# Patient Record
Sex: Female | Born: 1965 | Race: Black or African American | Hispanic: No | Smoking: Never smoker
Health system: Southern US, Community
[De-identification: ages and names within clinical notes are randomized; demographics above are authoritative.]

## PROBLEM LIST (undated history)

## (undated) DIAGNOSIS — D649 Anemia, unspecified: Secondary | ICD-10-CM

## (undated) DIAGNOSIS — E079 Disorder of thyroid, unspecified: Secondary | ICD-10-CM

## (undated) DIAGNOSIS — I10 Essential (primary) hypertension: Secondary | ICD-10-CM

## (undated) DIAGNOSIS — E039 Hypothyroidism, unspecified: Secondary | ICD-10-CM

---

## 1992-06-09 HISTORY — PX: WRIST SURGERY: SHX841

## 1998-06-09 HISTORY — PX: TUBAL LIGATION: SHX77

## 1998-10-22 ENCOUNTER — Other Ambulatory Visit: Admission: RE | Admit: 1998-10-22 | Discharge: 1998-10-22 | Payer: Self-pay | Admitting: Obstetrics and Gynecology

## 1999-04-24 ENCOUNTER — Inpatient Hospital Stay (HOSPITAL_COMMUNITY): Admission: AD | Admit: 1999-04-24 | Discharge: 1999-04-29 | Payer: Self-pay | Admitting: Obstetrics and Gynecology

## 1999-04-24 ENCOUNTER — Encounter (INDEPENDENT_AMBULATORY_CARE_PROVIDER_SITE_OTHER): Payer: Self-pay | Admitting: Specialist

## 2001-04-26 ENCOUNTER — Encounter: Admission: RE | Admit: 2001-04-26 | Discharge: 2001-04-26 | Payer: Self-pay | Admitting: Cardiology

## 2001-11-23 ENCOUNTER — Other Ambulatory Visit: Admission: RE | Admit: 2001-11-23 | Discharge: 2001-11-23 | Payer: Self-pay | Admitting: Obstetrics and Gynecology

## 2002-11-29 ENCOUNTER — Other Ambulatory Visit: Admission: RE | Admit: 2002-11-29 | Discharge: 2002-11-29 | Payer: Self-pay | Admitting: Obstetrics and Gynecology

## 2003-11-10 ENCOUNTER — Other Ambulatory Visit: Admission: RE | Admit: 2003-11-10 | Discharge: 2003-11-10 | Payer: Self-pay | Admitting: *Deleted

## 2003-12-14 ENCOUNTER — Other Ambulatory Visit: Admission: RE | Admit: 2003-12-14 | Discharge: 2003-12-14 | Payer: Self-pay | Admitting: *Deleted

## 2004-04-16 ENCOUNTER — Emergency Department (HOSPITAL_COMMUNITY): Admission: EM | Admit: 2004-04-16 | Discharge: 2004-04-16 | Payer: Self-pay | Admitting: Emergency Medicine

## 2004-04-16 ENCOUNTER — Inpatient Hospital Stay (HOSPITAL_COMMUNITY): Admission: AD | Admit: 2004-04-16 | Discharge: 2004-04-16 | Payer: Self-pay | Admitting: Family Medicine

## 2005-01-23 ENCOUNTER — Other Ambulatory Visit: Admission: RE | Admit: 2005-01-23 | Discharge: 2005-01-23 | Payer: Self-pay | Admitting: *Deleted

## 2005-06-16 ENCOUNTER — Other Ambulatory Visit: Admission: RE | Admit: 2005-06-16 | Discharge: 2005-06-16 | Payer: Self-pay | Admitting: Obstetrics and Gynecology

## 2005-10-21 ENCOUNTER — Encounter: Admission: RE | Admit: 2005-10-21 | Discharge: 2005-10-21 | Payer: Self-pay | Admitting: Obstetrics and Gynecology

## 2006-04-24 ENCOUNTER — Other Ambulatory Visit: Admission: RE | Admit: 2006-04-24 | Discharge: 2006-04-24 | Payer: Self-pay | Admitting: Obstetrics & Gynecology

## 2006-12-24 ENCOUNTER — Encounter: Admission: RE | Admit: 2006-12-24 | Discharge: 2006-12-24 | Payer: Self-pay | Admitting: Obstetrics and Gynecology

## 2007-05-11 ENCOUNTER — Other Ambulatory Visit: Admission: RE | Admit: 2007-05-11 | Discharge: 2007-05-11 | Payer: Self-pay | Admitting: Obstetrics and Gynecology

## 2008-04-10 ENCOUNTER — Encounter: Admission: RE | Admit: 2008-04-10 | Discharge: 2008-04-10 | Payer: Self-pay | Admitting: Obstetrics and Gynecology

## 2008-05-19 ENCOUNTER — Other Ambulatory Visit: Admission: RE | Admit: 2008-05-19 | Discharge: 2008-05-19 | Payer: Self-pay | Admitting: Obstetrics and Gynecology

## 2009-04-10 ENCOUNTER — Encounter: Admission: RE | Admit: 2009-04-10 | Discharge: 2009-04-10 | Payer: Self-pay | Admitting: Obstetrics and Gynecology

## 2010-06-04 ENCOUNTER — Encounter
Admission: RE | Admit: 2010-06-04 | Discharge: 2010-06-04 | Payer: Self-pay | Source: Home / Self Care | Attending: Obstetrics and Gynecology | Admitting: Obstetrics and Gynecology

## 2010-06-16 ENCOUNTER — Emergency Department (HOSPITAL_COMMUNITY)
Admission: EM | Admit: 2010-06-16 | Discharge: 2010-06-16 | Payer: Self-pay | Source: Home / Self Care | Admitting: Emergency Medicine

## 2010-06-24 LAB — BASIC METABOLIC PANEL
BUN: 8 mg/dL (ref 6–23)
CO2: 23 mEq/L (ref 19–32)
Calcium: 9 mg/dL (ref 8.4–10.5)
Chloride: 108 mEq/L (ref 96–112)
Creatinine, Ser: 0.69 mg/dL (ref 0.4–1.2)
GFR calc Af Amer: >60 mL/min (ref >60)
GFR calc non Af Amer: >60 mL/min (ref >60)
Glucose, Bld: 104 mg/dL — ABNORMAL HIGH (ref 70–99)
Potassium: 4.3 mEq/L (ref 3.5–5.1)
Sodium: 137 mEq/L (ref 135–145)

## 2010-06-24 LAB — URINALYSIS, ROUTINE W REFLEX MICROSCOPIC
Bilirubin Urine: NEGATIVE
Hgb urine dipstick: NEGATIVE
Ketones, ur: NEGATIVE mg/dL
Nitrite: NEGATIVE
Protein, ur: NEGATIVE mg/dL
Specific Gravity, Urine: 1.02 (ref 1.005–1.030)
Urine Glucose, Fasting: NEGATIVE mg/dL
Urobilinogen, UA: 1 mg/dL (ref 0.0–1.0)
pH: 7.5 (ref 5.0–8.0)

## 2010-06-24 LAB — DIFFERENTIAL
Basophils Absolute: 0 10*3/uL (ref 0.0–0.1)
Basophils Relative: 0 % (ref 0–1)
Eosinophils Absolute: 0 10*3/uL (ref 0.0–0.7)
Eosinophils Relative: 0 % (ref 0–5)
Lymphocytes Relative: 22 % (ref 12–46)
Lymphs Abs: 1.1 10*3/uL (ref 0.7–4.0)
Monocytes Absolute: 0.3 10*3/uL (ref 0.1–1.0)
Monocytes Relative: 6 % (ref 3–12)
Neutro Abs: 3.6 10*3/uL (ref 1.7–7.7)
Neutrophils Relative %: 72 % (ref 43–77)

## 2010-06-24 LAB — CBC
HCT: 35.3 % — ABNORMAL LOW (ref 36.0–46.0)
Hemoglobin: 11.8 g/dL — ABNORMAL LOW (ref 12.0–15.0)
MCH: 32.8 pg (ref 26.0–34.0)
MCHC: 33.4 g/dL (ref 30.0–36.0)
MCV: 98.1 fL (ref 78.0–100.0)
Platelets: 205 10*3/uL (ref 150–400)
RBC: 3.6 MIL/uL — ABNORMAL LOW (ref 3.87–5.11)
RDW: 12.6 % (ref 11.5–15.5)
WBC: 5 10*3/uL (ref 4.0–10.5)

## 2010-06-24 LAB — POCT PREGNANCY, URINE: Preg Test, Ur: NEGATIVE

## 2010-07-01 ENCOUNTER — Encounter: Payer: Self-pay | Admitting: Obstetrics and Gynecology

## 2010-07-10 ENCOUNTER — Encounter: Payer: Self-pay | Admitting: Obstetrics and Gynecology

## 2010-10-25 NOTE — H&P (Signed)
[name]  Patient:    Carol [name]                     MRN: [id number] Adm. Date:  [id number] Attending:  Shaune [name] Dictator:   [name], C.N.M.                         History and Physical  HISTORY OF PRESENT ILLNESS:   [name] is a 45 year old, gravida 4, para 1-0-2-1, at 41-2/7 weeks who presents for induction secondary to postdates.  Pregnancy has been remarkable for:  1) Previous low transverse cesarean section with the plan for a VBAC.  2) Desires bilateral tubal ligation.  3) Positive Group B Strep. 4) Fibroids.  5) Slightly late to care.  6) First trimester bleeding.  7) Two abortions.  PRENATAL LABORATORY DATA:     Blood type is AB positive, rh antibody negative, DRL nonreactive, rubella titer positive, hepatitis B surface antigen negative, HIV nonreactive, sickle cell test negative, GC and Chlamydia cultures were negative, Pap smear was normal, Glucose Challenge was normal, AFP was normal.  Hemoglobin  upon entry into practice was 11.6, it was 10.6 at 28 weeks.  Group B Strep culture was positive at 36 weeks.  EDC of April 13, 1999, was established by last menstrual period and was in agreement with ultrasound at approximately 18 weeks.  HISTORY OF PRESENT PREGNANCY: The patient entered care for her new OB interview at 9 weeks, but then had a missed appointment and did not have her first OB appointment until approximately 15 weeks.  At that time, she advised that she would like to attempt a VBAC.  Records were reviewed from [name] in  [place], which documented a primary low transverse cesarean section with her previous birth.  She also advised that she wished to have a bilateral tubal ligation following the birth of her child.  She did have some musculoskeletal pain in her right breast, but no other issues.  She had a yeast infection at 27 weeks. She met with [name], M.D.  at 37 weeks to discuss bilateral tubal ligation.  She had an ultrasound at 36 weeks secondary to poor anatomy views at  last visit and previous cesarean section.  AFI was 9.8, cervix was 4.1 cm, and estimated fetal weight was in the 50th percentile.  The rest of the patients pregnancy was uncomplicated.  She did have a nonstress test at 41 weeks that was reactive.  PAST OBSTETRIC HISTORY:       In 1992 she had a primary low transverse cesarean  section for a female infant, weight 9 pounds 5 ounces at [redacted] weeks gestation.  She was in labor approximately 36 hours.  She had general anesthesia.  She advanced to cm in her labor.  She also had chorioamnionitis.  In 1997, she had a spontaneous miscarriage at 6 to [redacted] weeks gestation.  She did have a D&C.  In February of 1999 she had a 12-week therapeutic termination of pregnancy.  She did have anemia with her first pregnancy and was on iron supplementation.  She did have blues after er first pregnancy, but no significant postpartum depression.  She had spontaneous  rupture of membranes approximately 48 hours prior to going to the [name] with her first pregnancy and then was induced.  Fibroids were also diagnosed with her first pregnancy.  PAST MEDICAL HISTORY:  She was on Ortho-Novum 135 and stopped approximately two years ago and then has been a condom user since.  She had one abnormal Pap smear that was treated with an over-the-counter antifungal and a repeat was normal. She has had future sporadic yeast infections.  She has had sporadic episodes of  anemia.  She had her last UTI in 1996.  She did have a kidney infection in 1993 and was treatment with outpatient antibiotics by mouth.  PAST SURGICAL HISTORY:        Includes a cesarean section primary low transverse in 1992, a D&C in 1997, and wrist surgery in 1997.  She has sensitivity to shrimp nd fish which cause hives and swelling.  FAMILY HISTORY:               Her  maternal aunt had a stillborn.  Her maternal unt also had a baby born with down syndrome with advanced maternal age.  Paternal grandmother had an MI.  Paternal cousin had TB in 1966.  Her paternal grandfather had insulin-dependent diabetes mellitus and is now deceased.  Her paternal aunt  also has insulin-dependent diabetes mellitus.  Her maternal aunt has thyroid disease.  Her maternal aunt had a nervous breakdown and also had an abusive spouse.  GENETIC HISTORY:              Remarkable for the patients maternal first cousin  having a baby with downs at age 33.  The patient has a paternal first cousin with sickle cell trait and a maternal first cousin with mental retardation.  SOCIAL HISTORY:               The patient is single.  The father of the baby has been involved and supportive.  His name is Carol [name].  He is not currently present with her.  The patient is supported by her sister, Carol [name].  The patient is college educated and is employed at a [employer].  The patient is African-American in ethnicity.  She is of the [employer].  She has been followed by the certified nurse midwife service of [name].  She denies any alcohol, drug, or tobacco use during this pregnancy.  PHYSICAL EXAMINATION:  VITAL SIGNS:                  Stable.  The patient is afebrile.  HEENT:                        Within normal limits.  LUNGS:                        Bilateral breath sounds are clear.  HEART:                        Regular rate and rhythm without murmur.  BREASTS:                      Soft and nontender.  ABDOMEN:                      Fundal height is approximately 38 cm.  Estimated fetal weight 7-1/2 to 8 pounds.  Uterine contractions are every four to five minutes, mild quality.  PELVIC:                       Cervical examination fingertip, 60%,  vertex at -1  station.  Cervix is relatively anterior.  Fetal heart rate is reactive  with occasional mild variables.  The baseline is 150 to 160 with accellerations up to 180.  EXTREMITIES:                  Deep tendon reflexes are 2+ without clonus. There is a trace edema noted.   IMPRESSION:                   1. Intrauterine pregnancy at 41-2/7 weeks.                               2. Positive Group B Strep.                               3. Previous cesarean section with desire for                                  vaginal birth after cesarean section.                               4. Uterine activity which precludes use of topical                                  cervical ripening agents.  PLAN:                         1) Admit to birthing suite per consult with Erie Noe P. Pennie Rushing, M.D. as attending physician.  2) Routine certified nurse midwife orders.  3) Plan low dose Pitocin for cervical ripening during the night and then initiation of regular Pitocin induction in the a.m.  4) Positive Group B Strep prophylaxis will be undertaken with penicillin-G 5 million units, then 2.5 million units IV q.4h.  5) Close observation of maternal fetal status and progress of labor.  6) The patient plans bilateral tubal ligation. DD:  04/24/99 TD:  04/24/99 Job: [id number]

## 2010-10-25 NOTE — Op Note (Signed)
[facility]  Patient:    Carol [name]                     MRN: [id number] Proc. Date: 04/26/99 Adm. Date:  [id number] Attending:  Dierdre Forth [place]                           Operative Report  PREOPERATIVE DIAGNOSIS:       Previous cesarean section and desires vaginal birth                               after cesarean, post dates and desires permanent                               sterilization with failure to progress.  POSTOPERATIVE DIAGNOSIS:      Previous cesarean section and desires vaginal birth                               after cesarean, post dates and desires permanent                               sterilization with failure to progress.  OPERATION:                    Repeat low cervical transverse cesarean section with                               postpartum tubal ligation by Pomeroy method.  SURGEON:                      Georgina Peer, M.D.  ASSISTANT:                    Maggie Schwalbe, C.N.M.  ANESTHESIA:                   Epidural - J. Leilani Able, Montez Hageman., M.D.  ESTIMATED BLOOD LOSS:         800 cc.  FINDINGS:                     A 10 pound and 5.9 ounce female infant at 6:28 a.m.  Meconium stained fluid.  Apgars of 4 and 8.  Normal tubes and ovaries.  INDICATIONS:                  This is a 45 year old African-American female, gravida 4, para 1-0-2-1 at 41-4/7 weeks.  Underwent induction for post dates because she desired VBAC.  She also desired tubal ligation postpartum.  The patient progressed in labor with Pitocin.  She reached 6 cm, but did not progress pass cm despite adequate labor.  The vertex did not descend beyond minus 2 station. She was counseled on risks and benefits of C-section and accepted, and planned tubal ligation.  DESCRIPTION OF PROCEDURE:     The patient had had an epidural placed during labor, and a Foley catheter placed during labor.  She was taken to the operating room nd given  adequate operative dosing.  She was prepped and draped.  Fetal heart tones were reactive.  A transverse incision was made in the skin through the previous  incision and taken to the peritoneal cavity in a normal Pfannenstiel manner. Bleeders were cauterized.  The peritoneal opening was widened sharply.  A transverse bladder flap was created and the bladder pushed down over the anterior uterine segment.  This was covered with a bladder blade.  A transverse uterine incision was made and at 6:28, an infant was dislodged from the pelvis in a transverse position.  There was particular meconium noted, ________ suction of he mouth and nose, and bulb suction was accomplished before the thorax was delivered and the baby cried.  The infant was born at 6:28, a female infant and was handed o the pediatric team in attendance after the cord was doubly clamped and cut. Apgars were sighted at 4 and 8.  The weight was 10 pounds and 9.9 ounces.  The uterine  incision was without extension and closed with one layer with running locked Vicryl.  A second layer of Vicryl in the midportion was used to control bleeding. Small bleeders along the bladder flap were cauterized, and the tubal ligation was accomplished by identifying the left tube with the ovary being normal.                                Next, the tube was elevated in its midportion and the knuckle of the tube was tied with two plain ties and the knuckle excised. Bleeders were cauterized.  The right tube was similarly elevated, normal ovary next to it. Two ties were placed around it and the knuckle excised.  This was sent to pathology.   There were small subserosal fibroids noted on the uterus, all less  than 1 cm.                                At this point, the uterine incision appeared dry.  Any bleeders were cauterized and the fascia was closed with Vicryl.  The subcutaneous tissue cauterized for any bleeding.  The skin edges opposed  well and the skin was closed with skin staples.  Sponge, needle and instrument counts were correct and the patient transferred to the recovery area in good condition.                                The patient did received 1 g of Cefotan after the  cord was clamped. DD:  04/26/99 TD:  04/29/99 Job: [id number]

## 2010-11-07 ENCOUNTER — Inpatient Hospital Stay (INDEPENDENT_AMBULATORY_CARE_PROVIDER_SITE_OTHER)
Admission: RE | Admit: 2010-11-07 | Discharge: 2010-11-07 | Disposition: A | Discharge status: Home / Self Care | Payer: PRIVATE HEALTH INSURANCE | Source: Ambulatory Visit | Attending: Emergency Medicine | Admitting: Emergency Medicine

## 2010-11-07 DIAGNOSIS — J019 Acute sinusitis, unspecified: Secondary | ICD-10-CM

## 2010-11-07 DIAGNOSIS — J3489 Other specified disorders of nose and nasal sinuses: Secondary | ICD-10-CM

## 2012-05-04 ENCOUNTER — Other Ambulatory Visit: Payer: Self-pay | Admitting: Obstetrics and Gynecology

## 2012-05-04 DIAGNOSIS — Z1231 Encounter for screening mammogram for malignant neoplasm of breast: Secondary | ICD-10-CM

## 2012-06-14 ENCOUNTER — Ambulatory Visit
Admission: RE | Admit: 2012-06-14 | Discharge: 2012-06-14 | Disposition: A | Discharge status: Home / Self Care | Payer: PRIVATE HEALTH INSURANCE | Source: Ambulatory Visit | Attending: Obstetrics and Gynecology | Admitting: Obstetrics and Gynecology

## 2012-06-14 DIAGNOSIS — Z1231 Encounter for screening mammogram for malignant neoplasm of breast: Secondary | ICD-10-CM

## 2012-08-01 ENCOUNTER — Emergency Department (HOSPITAL_COMMUNITY)
Admission: EM | Admit: 2012-08-01 | Discharge: 2012-08-01 | Disposition: A | Discharge status: Home / Self Care | Payer: PRIVATE HEALTH INSURANCE | Source: Home / Self Care | Attending: Family Medicine | Admitting: Family Medicine

## 2012-08-01 ENCOUNTER — Encounter (HOSPITAL_COMMUNITY): Payer: Self-pay | Admitting: Emergency Medicine

## 2012-08-01 DIAGNOSIS — J309 Allergic rhinitis, unspecified: Secondary | ICD-10-CM

## 2012-08-01 HISTORY — DX: Disorder of thyroid, unspecified: E07.9

## 2012-08-01 MED ORDER — METHYLPREDNISOLONE ACETATE 40 MG/ML IJ SUSP
80.0000 mg | Freq: Once | INTRAMUSCULAR | Status: AC
  Administered 2012-08-01: 80 mg via INTRAMUSCULAR

## 2012-08-01 MED ORDER — METHYLPREDNISOLONE ACETATE 80 MG/ML IJ SUSP
INTRAMUSCULAR | Status: AC
  Filled 2012-08-01: qty 1

## 2012-08-01 MED ORDER — FLUTICASONE PROPIONATE 50 MCG/ACT NA SUSP: 1.0000 | Freq: Two times a day (BID) | NASAL | Status: DC

## 2012-08-01 MED ORDER — FEXOFENADINE HCL 180 MG PO TABS: 180.0000 mg | ORAL_TABLET | Freq: Every day | ORAL | Status: DC

## 2012-08-01 NOTE — ED Notes (Signed)
Pt c/o sinus pressure and pain. Productive cough with green sputum. Pt denies fever, n/v/d. Sob and chest pain Pt states she has radioactive treatment for thyroid 3 wks ago. Hx thyroid disease.

## 2012-08-01 NOTE — ED Provider Notes (Signed)
History     CSN: [id number]  Arrival date & time 08/01/12  1101   First MD Initiated Contact with Patient 08/01/12 1104      Chief Complaint  Patient presents with  . Facial Pain    sinus pressure and pain. productive cough with green sputum.     (Consider location/radiation/quality/duration/timing/severity/associated sxs/prior treatment) Patient is a 47 y.o. female presenting with URI. The history is provided by the patient.  URI Presenting symptoms: congestion, cough and rhinorrhea   Presenting symptoms: no fever   Severity:  Mild Onset quality:  Gradual Timing:  Constant Progression:  Worsening Chronicity:  New Relieved by:  Nothing Worsened by:  Nothing tried Associated symptoms: sneezing   Associated symptoms comment:  Seasonal allergies.   Past Medical History  Diagnosis Date  . Thyroid disease     History reviewed. No pertinent past surgical history.  History reviewed. No pertinent family history.  History  Substance Use Topics  . Smoking status: Never Smoker   . Smokeless tobacco: Not on file  . Alcohol Use: No    OB History   Grav Para Term Preterm Abortions TAB SAB Ect Mult Living                  Review of Systems  Constitutional: Negative.  Negative for fever.  HENT: Positive for congestion, rhinorrhea, sneezing and postnasal drip.   Respiratory: Positive for cough.     Allergies  Review of patient's allergies indicates no known allergies.  Home Medications   Current Outpatient Rx  Name  Route  Sig  Dispense  Refill  . fexofenadine (ALLEGRA) 180 MG tablet   Oral   Take 1 tablet (180 mg total) by mouth daily.   30 tablet   1   . fluticasone (FLONASE) 50 MCG/ACT nasal spray   Nasal   Place 1 spray into the nose 2 (two) times daily.   1 g   2     BP 120/80  Pulse 90  Temp(Src) 98.2 F (36.8 C) (Oral)  Resp 16  SpO2 100%  LMP 07/29/2012  Physical Exam  Nursing note and vitals reviewed. Constitutional: She is oriented to  person, place, and time. She appears well-developed and well-nourished.  HENT:  Head: Normocephalic.  Right Ear: External ear normal.  Left Ear: External ear normal.  Mouth/Throat: Oropharynx is clear and moist.  Eyes: Pupils are equal, round, and reactive to light.  Neck: Normal range of motion. Neck supple.  Cardiovascular: Normal rate, regular rhythm, normal heart sounds and intact distal pulses.   Pulmonary/Chest: Effort normal and breath sounds normal.  Neurological: She is alert and oriented to person, place, and time.  Skin: Skin is warm and dry.    ED Course  Procedures (including critical care time)  Labs Reviewed - No data to display No results found.   1. Seasonal allergies       MDM          Linna Hoff, MD 08/01/12 [id number]

## 2012-08-05 ENCOUNTER — Ambulatory Visit: Payer: Self-pay | Admitting: Obstetrics and Gynecology

## 2012-08-09 ENCOUNTER — Encounter: Payer: Self-pay | Admitting: Family Medicine

## 2012-08-09 ENCOUNTER — Ambulatory Visit: Payer: PRIVATE HEALTH INSURANCE | Admitting: Family Medicine

## 2012-08-09 VITALS — BP 112/60 | HR 76 | Ht 65.0 in | Wt 158.0 lb

## 2012-08-09 DIAGNOSIS — N926 Irregular menstruation, unspecified: Secondary | ICD-10-CM

## 2012-08-09 DIAGNOSIS — Z113 Encounter for screening for infections with a predominantly sexual mode of transmission: Secondary | ICD-10-CM

## 2012-08-09 DIAGNOSIS — Z0000 Encounter for general adult medical examination without abnormal findings: Secondary | ICD-10-CM

## 2012-08-09 DIAGNOSIS — D219 Benign neoplasm of connective and other soft tissue, unspecified: Secondary | ICD-10-CM

## 2012-08-09 DIAGNOSIS — N920 Excessive and frequent menstruation with regular cycle: Secondary | ICD-10-CM

## 2012-08-09 LAB — LIPID PANEL
Cholesterol: 192 mg/dL (ref 0–200)
HDL: 87 mg/dL (ref >39)
LDL Cholesterol: 97 mg/dL (ref 0–99)
Total CHOL/HDL Ratio: 2.2 Ratio
Triglycerides: 40 mg/dL (ref <150)
VLDL: 8 mg/dL (ref 0–40)

## 2012-08-09 LAB — CBC
HCT: 34.3 % — ABNORMAL LOW (ref 36.0–46.0)
Hemoglobin: 11.6 g/dL — ABNORMAL LOW (ref 12.0–15.0)
MCH: 30.1 pg (ref 26.0–34.0)
MCHC: 33.8 g/dL (ref 30.0–36.0)
MCV: 89.1 fL (ref 78.0–100.0)
Platelets: 263 10*3/uL (ref 150–400)
RBC: 3.85 MIL/uL — ABNORMAL LOW (ref 3.87–5.11)
RDW: 14.2 % (ref 11.5–15.5)
WBC: 5.2 10*3/uL (ref 4.0–10.5)

## 2012-08-09 LAB — HIV ANTIBODY (ROUTINE TESTING W REFLEX): HIV: NONREACTIVE

## 2012-08-09 LAB — RPR

## 2012-08-09 NOTE — Progress Notes (Signed)
Last Pap Dec 2012 WNL: Yes Regular Periods:yes Contraception: BTL  Monthly Breast exam:yes Tetanus<33yrs:yes Nl.Bladder Function:yes Daily BMs:yes Healthy Diet:yes Calcium:no Mammogram:yes Date of Mammogram: Jan 2014 Exercise:no Have often Exercise: N/A Seatbelt: yes Abuse at home: no Stressful work:yes Sigmoid-colonoscopy: no Bone Density:None PCP: ? Change in PMH: None Change in ZOX:WRUE

## 2012-08-09 NOTE — Progress Notes (Signed)
Subjective:    [name] is a 47 y.o. female, No obstetric history on file., who presents for an annual exam. The patient reports heavier menstrual cycles with clots.  Menses x 5 days, heavy flow, must wear a pad/tampoon to prevent soiling, changes her pads q 2 hours.  Menses occurs q 30 days without BTB.  Menstrual cycle:   LMP: Patient's last menstrual period was 08/07/2012.             Review of Systems Pertinent items are noted in HPI. Denies pelvic pain, urinary tract symptoms, vaginitis symptoms, irregular bleeding, menopausal symptoms, change in bowel habits or rectal bleeding   Objective:    BP 112/60  Pulse 76  Ht 5\' 5"  (1.651 m)  Wt 158 lb (71.668 kg)  BMI 26.29 kg/m2  LMP 08/07/2012 BP 112/60  Pulse 76  Ht 5\' 5"  (1.651 m)  Wt 158 lb (71.668 kg)  BMI 26.29 kg/m2  LMP 08/07/2012  Wt Readings from Last 1 Encounters:  08/09/12 158 lb (71.668 kg)   Body mass index is 26.29 kg/(m^2). General Appearance: Alert, no acute distress HEENT: Grossly normal Neck / Thyroid: Supple, no thyromegaly or cervical adenopathy Lungs: Clear to auscultation bilaterally Back: No CVA tenderness Breast Exam: No masses or nodes.No dimpling, nipple retraction or discharge. Cardiovascular: Regular rate and rhythm.  Gastrointestinal: Soft, non-tender, no masses or organomegaly Pelvic Exam: EGBUS-wnl, vagina-normal rugae, cervix- without lesions or tenderness, uterus appears large, with noticeable mass, but mobile and measures about 12 cm from pelvic bone, adnexae-no masses or tenderness. Lymphatic Exam: Non-palpable nodes in neck, clavicular,  axillary, or inguinal regions  Skin: no rashes or abnormalities Extremities: no clubbing cyanosis or edema  Neurologic: grossly normal Psychiatric: Alert and oriented  Wet Prep: menses too heavy    Assessment:   Routine GYN Exam Excessive Menses   Plan:  1. Pelvic U/S for evaluation of uterine mass 2. Labs for STD screening  (GC/CL/HIV/RPR)  patient request and Lipid profile(pt fasting). 3. CBC for excessive menses.   Elane Fritz FNP-BC

## 2012-08-10 LAB — GC/CHLAMYDIA PROBE AMP
CT Probe RNA: NEGATIVE
GC Probe RNA: NEGATIVE

## 2012-08-17 ENCOUNTER — Telehealth: Payer: Self-pay | Admitting: Obstetrics and Gynecology

## 2012-08-17 NOTE — Telephone Encounter (Signed)
LVM to advise of normal labs  Cresencia Asmus, CMA  

## 2012-08-20 ENCOUNTER — Ambulatory Visit: Payer: Self-pay | Admitting: Family Medicine

## 2012-12-04 ENCOUNTER — Encounter (HOSPITAL_COMMUNITY): Payer: Self-pay | Admitting: Emergency Medicine

## 2012-12-04 ENCOUNTER — Emergency Department (HOSPITAL_COMMUNITY)
Admission: EM | Admit: 2012-12-04 | Discharge: 2012-12-04 | Disposition: A | Discharge status: Home / Self Care | Payer: PRIVATE HEALTH INSURANCE | Source: Home / Self Care

## 2012-12-04 DIAGNOSIS — B372 Candidiasis of skin and nail: Secondary | ICD-10-CM

## 2012-12-04 MED ORDER — CLOTRIMAZOLE-BETAMETHASONE 1-0.05 % EX CREA: TOPICAL_CREAM | CUTANEOUS | Status: DC

## 2012-12-04 NOTE — ED Provider Notes (Signed)
Medical screening examination/treatment/procedure(s) were performed by non-physician practitioner and as supervising physician I was immediately available for consultation/collaboration.  Leslee Home, M.D.  Reuben Likes, MD 12/04/12 [id number]

## 2012-12-04 NOTE — ED Provider Notes (Signed)
   History    CSN: [id number] Arrival date & time 12/04/12  1555  First MD Initiated Contact with Patient 12/04/12 1701     Chief Complaint  Patient presents with  . Rash   (Consider location/radiation/quality/duration/timing/severity/associated sxs/prior Treatment) HPI Comments: 47 year old female complains of a slightly itchy rash in the left inguinal fold for about one week.  Past Medical History  Diagnosis Date  . Thyroid disease    Past Surgical History  Procedure Laterality Date  . Cesarean section    . Wrist surgery Right 1994   No family history on file. History  Substance Use Topics  . Smoking status: Never Smoker   . Smokeless tobacco: Not on file  . Alcohol Use: No   OB History   Grav Para Term Preterm Abortions TAB SAB Ect Mult Living                 Review of Systems  All other systems reviewed and are negative.    Allergies  Review of patient's allergies indicates no known allergies.  Home Medications   Current Outpatient Rx  Name  Route  Sig  Dispense  Refill  . levothyroxine (SYNTHROID, LEVOTHROID) 125 MCG tablet   Oral   Take 125 mcg by mouth daily before breakfast.         . fexofenadine (ALLEGRA) 180 MG tablet   Oral   Take 1 tablet (180 mg total) by mouth daily.   30 tablet   1   . fluticasone (FLONASE) 50 MCG/ACT nasal spray   Nasal   Place 1 spray into the nose 2 (two) times daily.   1 g   2    BP 133/73  Pulse 77  Temp(Src) 98.2 F (36.8 C) (Oral)  Resp 18  SpO2 99%  LMP 11/15/2012 Physical Exam  Nursing note and vitals reviewed. Constitutional: She is oriented to person, place, and time. She appears well-developed and well-nourished.  Pulmonary/Chest: Effort normal.  Neurological: She is alert and oriented to person, place, and time. She exhibits normal muscle tone.  Skin: Skin is warm and dry. Rash noted.  Well marginated mildly erythemic and rough texture rash with a slimy discharge in the left inguinal fold at  the skin contact areas. No areas of infection and no areas of skin breakdown.  Psychiatric: She has a normal mood and affect.    ED Course  Procedures (including critical care time) Labs Reviewed - No data to display No results found. 1. Candidal dermatitis     MDM  Keep area dry. After showering use a hairdryer to keep the inguinal folds dry. Lotrisone cream to the affected area twice a day and use for 5 days after disappearance of the rash.  Hayden Rasmussen, NP 12/04/12 1746

## 2012-12-04 NOTE — ED Notes (Signed)
Pt c/o rash in the left side inguinal area near the groin onset 1 week... sxs include itchiness... Denies fevers... Reports she just started a new dose of thyroid meds... She is alert w/no signs of acute distress.

## 2013-04-06 ENCOUNTER — Ambulatory Visit: Payer: Self-pay | Admitting: Podiatry

## 2013-04-07 ENCOUNTER — Ambulatory Visit: Payer: Self-pay | Admitting: Podiatry

## 2013-12-01 DIAGNOSIS — E890 Postprocedural hypothyroidism: Secondary | ICD-10-CM | POA: Insufficient documentation

## 2014-03-08 ENCOUNTER — Other Ambulatory Visit: Payer: Self-pay

## 2014-03-08 DIAGNOSIS — Z1231 Encounter for screening mammogram for malignant neoplasm of breast: Secondary | ICD-10-CM

## 2014-03-21 ENCOUNTER — Ambulatory Visit
Admission: RE | Admit: 2014-03-21 | Discharge: 2014-03-21 | Disposition: A | Discharge status: Home / Self Care | Payer: PRIVATE HEALTH INSURANCE | Source: Ambulatory Visit

## 2014-03-21 DIAGNOSIS — Z1231 Encounter for screening mammogram for malignant neoplasm of breast: Secondary | ICD-10-CM

## 2014-04-10 ENCOUNTER — Ambulatory Visit: Payer: Self-pay | Admitting: Podiatry

## 2014-04-17 ENCOUNTER — Ambulatory Visit: Payer: Self-pay | Admitting: Podiatry

## 2014-04-24 ENCOUNTER — Ambulatory Visit: Payer: Self-pay | Admitting: Podiatry

## 2014-05-07 ENCOUNTER — Encounter (HOSPITAL_COMMUNITY): Payer: Self-pay | Admitting: *Deleted

## 2014-05-07 ENCOUNTER — Emergency Department (INDEPENDENT_AMBULATORY_CARE_PROVIDER_SITE_OTHER)
Admission: EM | Admit: 2014-05-07 | Discharge: 2014-05-07 | Disposition: A | Discharge status: Home / Self Care | Payer: PRIVATE HEALTH INSURANCE | Source: Home / Self Care | Attending: Family Medicine | Admitting: Family Medicine

## 2014-05-07 DIAGNOSIS — M79661 Pain in right lower leg: Secondary | ICD-10-CM

## 2014-05-07 NOTE — ED Notes (Signed)
Pt  Reports  Sensation of  Swelling       And  Tenderness  To   r  Lower  Leg       -       denys  Any known  Injury             No  Redness  No  Deformity  -  Pt  Sitting upright on  The  Exam table  And  Is  Speaking in  Complete  sentances

## 2014-05-07 NOTE — Discharge Instructions (Signed)
See dermatologist if further concern

## 2014-05-07 NOTE — ED Provider Notes (Signed)
CSN: [id number]     Arrival date & time 05/07/14  1649 History   First MD Initiated Contact with Patient 05/07/14 1702     Chief Complaint  Patient presents with  . Leg Problem   (Consider location/radiation/quality/duration/timing/severity/associated sxs/prior Treatment) Patient is a 47 y.o. female presenting with rash. The history is provided by the patient.  Rash Location:  Leg Leg rash location:  R lower leg Quality comment:  Nothing present. Duration:  1 month Progression:  Unchanged (onset as apparent bruise, now just sore to deep palp.) Chronicity:  Chronic   Past Medical History  Diagnosis Date  . Thyroid disease    Past Surgical History  Procedure Laterality Date  . Cesarean section    . Wrist surgery Right 1994   History reviewed. No pertinent family history. History  Substance Use Topics  . Smoking status: Never Smoker   . Smokeless tobacco: Not on file  . Alcohol Use: No   OB History    No data available     Review of Systems  Constitutional: Negative.   Skin: Positive for rash.    Allergies  Review of patient's allergies indicates no known allergies.  Home Medications   Prior to Admission medications   Medication Sig Start Date End Date Taking? Authorizing Provider  clotrimazole-betamethasone (LOTRISONE) cream Apply to affected area 2 times daily prn 12/04/12   Janne Napoleon, NP  fexofenadine (ALLEGRA) 180 MG tablet Take 1 tablet (180 mg total) by mouth daily. 08/01/12   Billy Fischer, MD  fluticasone (FLONASE) 50 MCG/ACT nasal spray Place 1 spray into the nose 2 (two) times daily. 08/01/12   Billy Fischer, MD  levothyroxine (SYNTHROID, LEVOTHROID) 125 MCG tablet Take 125 mcg by mouth daily before breakfast.    Historical Provider, MD   BP 137/88 mmHg  Pulse 77  Temp(Src) 98 F (36.7 C) (Oral)  Resp 16  SpO2 98%  LMP 04/18/2014 Physical Exam  Constitutional: She is oriented to person, place, and time. She appears well-developed and  well-nourished.  Musculoskeletal: She exhibits no edema or tenderness.  Neurological: She is alert and oriented to person, place, and time.  Skin: Skin is warm and dry. No rash noted. No erythema.  No rash or lesion seen, pt nonlocalizing, , no skin changes.  Nursing note and vitals reviewed.   ED Course  Procedures (including critical care time) Labs Review Labs Reviewed - No data to display  Imaging Review No results found.   MDM   1. Pain of right lower leg        Billy Fischer, MD 05/07/14 1723

## 2014-06-22 ENCOUNTER — Encounter (HOSPITAL_COMMUNITY): Payer: Self-pay | Admitting: Emergency Medicine

## 2014-06-22 ENCOUNTER — Emergency Department (INDEPENDENT_AMBULATORY_CARE_PROVIDER_SITE_OTHER)
Admission: EM | Admit: 2014-06-22 | Discharge: 2014-06-22 | Disposition: A | Discharge status: Home / Self Care | Payer: PRIVATE HEALTH INSURANCE | Source: Home / Self Care | Attending: Family Medicine | Admitting: Family Medicine

## 2014-06-22 ENCOUNTER — Observation Stay (HOSPITAL_COMMUNITY)
Admission: EM | Admit: 2014-06-22 | Discharge: 2014-06-23 | Disposition: A | Discharge status: Home / Self Care | Payer: PRIVATE HEALTH INSURANCE | Attending: Internal Medicine | Admitting: Internal Medicine

## 2014-06-22 DIAGNOSIS — I10 Essential (primary) hypertension: Principal | ICD-10-CM | POA: Insufficient documentation

## 2014-06-22 DIAGNOSIS — R030 Elevated blood-pressure reading, without diagnosis of hypertension: Secondary | ICD-10-CM

## 2014-06-22 DIAGNOSIS — H539 Unspecified visual disturbance: Secondary | ICD-10-CM | POA: Insufficient documentation

## 2014-06-22 DIAGNOSIS — Z79899 Other long term (current) drug therapy: Secondary | ICD-10-CM | POA: Diagnosis not present

## 2014-06-22 DIAGNOSIS — R079 Chest pain, unspecified: Secondary | ICD-10-CM | POA: Diagnosis not present

## 2014-06-22 DIAGNOSIS — F439 Reaction to severe stress, unspecified: Secondary | ICD-10-CM

## 2014-06-22 DIAGNOSIS — IMO0001 Reserved for inherently not codable concepts without codable children: Secondary | ICD-10-CM | POA: Diagnosis present

## 2014-06-22 DIAGNOSIS — Z7951 Long term (current) use of inhaled steroids: Secondary | ICD-10-CM | POA: Insufficient documentation

## 2014-06-22 DIAGNOSIS — R51 Headache: Secondary | ICD-10-CM | POA: Diagnosis present

## 2014-06-22 DIAGNOSIS — E039 Hypothyroidism, unspecified: Secondary | ICD-10-CM | POA: Diagnosis present

## 2014-06-22 DIAGNOSIS — Z658 Other specified problems related to psychosocial circumstances: Secondary | ICD-10-CM

## 2014-06-22 DIAGNOSIS — H538 Other visual disturbances: Secondary | ICD-10-CM | POA: Diagnosis present

## 2014-06-22 DIAGNOSIS — G44209 Tension-type headache, unspecified, not intractable: Secondary | ICD-10-CM

## 2014-06-22 HISTORY — DX: Hypothyroidism, unspecified: E03.9

## 2014-06-22 MED ORDER — SODIUM CHLORIDE 0.9 % IV BOLUS (SEPSIS)
1000.0000 mL | Freq: Once | INTRAVENOUS | Status: AC
  Administered 2014-06-23: 1000 mL via INTRAVENOUS

## 2014-06-22 NOTE — ED Notes (Addendum)
Pt states that she started to have a headache this morning about 0300, pt states that she had blurred vision at work which is now clear.

## 2014-06-22 NOTE — ED Notes (Signed)
Pt st's she woke up this am with a headache st's she went to Urgent Care.  Pt st's they told her to go home and rest.  St's she went to [facility] and checked her blood pressure and it was high.

## 2014-06-22 NOTE — Discharge Instructions (Signed)
Tension Headache A tension headache is a feeling of pain, pressure, or aching often felt over the front and sides of the head. The pain can be dull or can feel tight (constricting). It is the most common type of headache. Tension headaches are not normally associated with nausea or vomiting and do not get worse with physical activity. Tension headaches can last 30 minutes to several days.  CAUSES  The exact cause is not known, but it may be caused by chemicals and hormones in the brain that lead to pain. Tension headaches often begin after stress, anxiety, or depression. Other triggers may include:  Alcohol.  Caffeine (too much or withdrawal).  Respiratory infections (colds, flu, sinus infections).  Dental problems or teeth clenching.  Fatigue.  Holding your head and neck in one position too long while using a computer. SYMPTOMS   Pressure around the head.   Dull, aching head pain.   Pain felt over the front and sides of the head.   Tenderness in the muscles of the head, neck, and shoulders. DIAGNOSIS  A tension headache is often diagnosed based on:   Symptoms.   Physical examination.   A CT scan or MRI of your head. These tests may be ordered if symptoms are severe or unusual. TREATMENT  Medicines may be given to help relieve symptoms.  HOME CARE INSTRUCTIONS   Only take over-the-counter or prescription medicines for pain or discomfort as directed by your caregiver.   Lie down in a dark, quiet room when you have a headache.   Keep a journal to find out what may be triggering your headaches. For example, write down:  What you eat and drink.  How much sleep you get.  Any change to your diet or medicines.  Try massage or other relaxation techniques.   Ice packs or heat applied to the head and neck can be used. Use these 3 to 4 times per day for 15 to 20 minutes each time, or as needed.   Limit stress.   Sit up straight, and do not tense your muscles.    Quit smoking if you smoke.  Limit alcohol use.  Decrease the amount of caffeine you drink, or stop drinking caffeine.  Eat and exercise regularly.  Get 7 to 9 hours of sleep, or as recommended by your caregiver.  Avoid excessive use of pain medicine as recurrent headaches can occur.  SEEK MEDICAL CARE IF:   You have problems with the medicines you were prescribed.  Your medicines do not work.  You have a change from the usual headache.  You have nausea or vomiting. SEEK IMMEDIATE MEDICAL CARE IF:   Your headache becomes severe.  You have a fever.  You have a stiff neck.  You have loss of vision.  You have muscular weakness or loss of muscle control.  You lose your balance or have trouble walking.  You feel faint or pass out.  You have severe symptoms that are different from your first symptoms. MAKE SURE YOU:   Understand these instructions.  Will watch your condition.  Will get help right away if you are not doing well or get worse. Document Released: 05/26/2005 Document Revised: 08/18/2011 Document Reviewed: 05/16/2011 [facility] Patient Information 2015 [place], [place]. This information is not intended to replace advice given to you by your health care provider. Make sure you discuss any questions you have with your health care provider.  Stress and Stress Management Stress is a normal reaction to life events.  It is what you feel when life demands more than you are used to or more than you can handle. Some stress can be useful. For example, the stress reaction can help you catch the last bus of the day, study for a test, or meet a deadline at work. But stress that occurs too often or for too long can cause problems. It can affect your emotional health and interfere with relationships and normal daily activities. Too much stress can weaken your immune system and increase your risk for physical illness. If you already have a medical problem, stress can make it  worse. CAUSES  All sorts of life events may cause stress. An event that causes stress for one person may not be stressful for another person. Major life events commonly cause stress. These may be positive or negative. Examples include losing your job, moving into a new home, getting married, having a baby, or losing a loved one. Less obvious life events may also cause stress, especially if they occur day after day or in combination. Examples include working long hours, driving in traffic, caring for children, being in debt, or being in a difficult relationship. SIGNS AND SYMPTOMS Stress may cause emotional symptoms including, the following:  Anxiety. This is feeling worried, afraid, on edge, overwhelmed, or out of control.  Anger. This is feeling irritated or impatient.  Depression. This is feeling sad, down, helpless, or guilty.  Difficulty focusing, remembering, or making decisions. Stress may cause physical symptoms, including the following:   Aches and pains. These may affect your head, neck, back, stomach, or other areas of your body.  Tight muscles or clenched jaw.  Low energy or trouble sleeping. Stress may cause unhealthy behaviors, including the following:   Eating to feel better (overeating) or skipping meals.  Sleeping too little, too much, or both.  Working too much or putting off tasks (procrastination).  Smoking, drinking alcohol, or using drugs to feel better. DIAGNOSIS  Stress is diagnosed through an assessment by your health care provider. Your health care provider will ask questions about your symptoms and any stressful life events.Your health care provider will also ask about your medical history and may order blood tests or other tests. Certain medical conditions and medicine can cause physical symptoms similar to stress. Mental illness can cause emotional symptoms and unhealthy behaviors similar to stress. Your health care provider may refer you to a mental  health professional for further evaluation.  TREATMENT  Stress management is the recommended treatment for stress.The goals of stress management are reducing stressful life events and coping with stress in healthy ways.  Techniques for reducing stressful life events include the following:  Stress identification. Self-monitor for stress and identify what causes stress for you. These skills may help you to avoid some stressful events.  Time management. Set your priorities, keep a calendar of events, and learn to say "no." These tools can help you avoid making too many commitments. Techniques for coping with stress include the following:  Rethinking the problem. Try to think realistically about stressful events rather than ignoring them or overreacting. Try to find the positives in a stressful situation rather than focusing on the negatives.  Exercise. Physical exercise can release both physical and emotional tension. The key is to find a form of exercise you enjoy and do it regularly.  Relaxation techniques. These relax the body and mind. Examples include yoga, meditation, tai chi, biofeedback, deep breathing, progressive muscle relaxation, listening to music, being out in nature,  journaling, and other hobbies. Again, the key is to find one or more that you enjoy and can do regularly.  Healthy lifestyle. Eat a balanced diet, get plenty of sleep, and do not smoke. Avoid using alcohol or drugs to relax.  Strong support network. Spend time with family, friends, or other people you enjoy being around.Express your feelings and talk things over with someone you trust. Counseling or talktherapy with a mental health professional may be helpful if you are having difficulty managing stress on your own. Medicine is typically not recommended for the treatment of stress.Talk to your health care provider if you think you need medicine for symptoms of stress. HOME CARE INSTRUCTIONS  Keep all follow-up  visits as directed by your health care provider.  Take all medicines as directed by your health care provider. SEEK MEDICAL CARE IF:  Your symptoms get worse or you start having new symptoms.  You feel overwhelmed by your problems and can no longer manage them on your own. SEEK IMMEDIATE MEDICAL CARE IF:  You feel like hurting yourself or someone else. Document Released: 11/19/2000 Document Revised: 10/10/2013 Document Reviewed: 01/18/2013 [facility] Patient Information 2015 [place], [place]. This information is not intended to replace advice given to you by your health care provider. Make sure you discuss any questions you have with your health care provider.

## 2014-06-22 NOTE — ED Provider Notes (Signed)
CSN: [id number]     Arrival date & time 06/22/14  1128 History   First MD Initiated Contact with Patient 06/22/14 1148     Chief Complaint  Patient presents with  . Headache   (Consider location/radiation/quality/duration/timing/severity/associated sxs/prior Treatment) HPI        49 year old female presents for evaluation of a headache. She has a mild headache across her forehead. It started at 3:00 this morning. She denies any history of headaches. She tried taking ibuprofen which helped the headache a lot. She denies any injury, nausea, vomiting. She did have an episode of blurry vision earlier this afternoon and lasted for a couple of minutes. Denies any numbness or weakness. She admits to a lot of increased stress in her life, she thinks this may be contributing   Past Medical History  Diagnosis Date  . Thyroid disease   . Hypothyroidism    Past Surgical History  Procedure Laterality Date  . Cesarean section    . Wrist surgery Right 1994   History reviewed. No pertinent family history. History  Substance Use Topics  . Smoking status: Never Smoker   . Smokeless tobacco: Not on file  . Alcohol Use: No   OB History    No data available     Review of Systems  Constitutional: Negative for fever and chills.  Eyes: Negative for visual disturbance.  Musculoskeletal: Negative for neck stiffness.  Neurological: Positive for headaches. Negative for dizziness, seizures, speech difficulty and light-headedness.  All other systems reviewed and are negative.   Allergies  Review of patient's allergies indicates no known allergies.  Home Medications   Prior to Admission medications   Medication Sig Start Date End Date Taking? Authorizing Provider  levothyroxine (SYNTHROID, LEVOTHROID) 125 MCG tablet Take 125 mcg by mouth daily before breakfast.   Yes Historical Provider, MD  clotrimazole-betamethasone (LOTRISONE) cream Apply to affected area 2 times daily prn 12/04/12   Janne Napoleon,  NP  fexofenadine (ALLEGRA) 180 MG tablet Take 1 tablet (180 mg total) by mouth daily. 08/01/12   Billy Fischer, MD  fluticasone (FLONASE) 50 MCG/ACT nasal spray Place 1 spray into the nose 2 (two) times daily. 08/01/12   Billy Fischer, MD   BP 142/103 mmHg  Pulse 68  Temp(Src) 98.2 F (36.8 C) (Oral)  Resp 16  SpO2 98%  LMP 06/09/2014 Physical Exam  Constitutional: She is oriented to person, place, and time. Vital signs are normal. She appears well-developed and well-nourished. No distress.  HENT:  Head: Normocephalic and atraumatic.  Pulmonary/Chest: Effort normal. No respiratory distress.  Neurological: She is alert and oriented to person, place, and time. She has normal strength and normal reflexes. She is not disoriented. No cranial nerve deficit or sensory deficit. She exhibits normal muscle tone. She displays a negative Romberg sign. Coordination and gait normal. GCS eye subscore is 4. GCS verbal subscore is 5. GCS motor subscore is 6.  Skin: Skin is warm and dry. No rash noted. She is not diaphoretic.  Psychiatric: She has a normal mood and affect. Judgment normal.  Nursing note and vitals reviewed.   ED Course  Procedures (including critical care time) Labs Review Labs Reviewed - No data to display  Imaging Review No results found.  Visual acuity is 20/20 bilaterally MDM   1. Tension headache   2. Stress    Advised her to use Excedrin Migraine for any future headaches. Now, no treatment indicated. Her symptoms have mostly resolved. We discussed ways for her  to deal with stress in a healthy way. Follow-up with primary care       Liam Graham, PA-C 06/22/14 1533

## 2014-06-22 NOTE — ED Provider Notes (Signed)
CSN: [id number]     Arrival date & time 06/22/14  2045 History  This chart was scribed for Everlene Balls, MD by Hilda Lias, ED Scribe. This patient was seen in room D35C/D35C and the patient's care was started at 11:38 PM.    Chief Complaint  Patient presents with  . Hypertension    The history is provided by the patient. No language interpreter was used.     HPI Comments: Carol [name] is a 49 y.o. female who presents to the Emergency Department complaining of constant high blood pressure with associated headache and blurry vision that has been present since 0300 this morning. Pt states that she woke up around 0300 with a headache and took some ibuprofen, then awoke again at 0800 and took her blood pressure, which she states was 130/100. Marland Kitchen Pt states that she went to work and began to have blurry vision, so she she left and went to an Urgent Care. Pt states she was seen at Urgent Care and told to go home and rest. Pt states that she rested until around 3 hours PTA, when she got up to go to [place]. Pt states she took her blood pressure while at [place], and notes it was 170/110, so she came immediately to ED. In addition, pt notes having intermittent right-sided chest pain that has been present for 2-3 days. Pt states that nothing makes it worse or better, and that its onset is completely random. Pt states that pain is an aching sensation without any radiation.Pt denies SOB, vomiting, diaphoresis, and denies being sick recently.     Past Medical History  Diagnosis Date  . Thyroid disease   . Hypothyroidism    Past Surgical History  Procedure Laterality Date  . Cesarean section    . Wrist surgery Right 1994   No family history on file. History  Substance Use Topics  . Smoking status: Never Smoker   . Smokeless tobacco: Not on file  . Alcohol Use: No   OB History    No data available     Review of Systems  Constitutional: Negative for diaphoresis.  Eyes: Positive for  visual disturbance.  Respiratory: Negative for shortness of breath.   Cardiovascular: Positive for chest pain.  Gastrointestinal: Negative for vomiting.  Neurological: Positive for headaches.  All other systems reviewed and are negative.     Allergies  Review of patient's allergies indicates no known allergies.  Home Medications   Prior to Admission medications   Medication Sig Start Date End Date Taking? Authorizing Provider  clotrimazole-betamethasone (LOTRISONE) cream Apply to affected area 2 times daily prn 12/04/12   Janne Napoleon, NP  fexofenadine (ALLEGRA) 180 MG tablet Take 1 tablet (180 mg total) by mouth daily. 08/01/12   Billy Fischer, MD  fluticasone (FLONASE) 50 MCG/ACT nasal spray Place 1 spray into the nose 2 (two) times daily. 08/01/12   Billy Fischer, MD  levothyroxine (SYNTHROID, LEVOTHROID) 125 MCG tablet Take 125 mcg by mouth daily before breakfast.    Historical Provider, MD   BP 174/104 mmHg  Pulse 78  Temp(Src) 98.9 F (37.2 C) (Oral)  Resp 18  SpO2 100%  LMP 06/09/2014 Physical Exam  Constitutional: She is oriented to person, place, and time. She appears well-developed and well-nourished. No distress.  HENT:  Head: Normocephalic and atraumatic.  Eyes: Conjunctivae and EOM are normal. Pupils are equal, round, and reactive to light. No scleral icterus.  Neck: Normal range of motion. Neck supple. No JVD  present. No tracheal deviation present. No thyromegaly present.  Cardiovascular: Normal rate, regular rhythm and normal heart sounds.  Exam reveals no gallop and no friction rub.   No murmur heard. Pulmonary/Chest: Effort normal and breath sounds normal. No respiratory distress. She has no wheezes. She exhibits no tenderness.  Abdominal: Soft. Bowel sounds are normal. She exhibits no distension and no mass. There is no tenderness. There is no rebound and no guarding.  Musculoskeletal: Normal range of motion. She exhibits no edema or tenderness.  Lymphadenopathy:     She has no cervical adenopathy.  Neurological: She is alert and oriented to person, place, and time. No cranial nerve deficit. She exhibits normal muscle tone.  Normal strength and sensation x 4 ext, normal cerebellar testing, normal gait  Skin: Skin is warm and dry. No rash noted. No erythema. No pallor.  Nursing note and vitals reviewed.   ED Course  Procedures (including critical care time)  DIAGNOSTIC STUDIES: Oxygen Saturation is 98% on RA, normal by my interpretation.    COORDINATION OF CARE: 11:47 PM Discussed treatment plan with pt at bedside and pt agreed to plan.   Labs Review Labs Reviewed  CBC WITH DIFFERENTIAL - Abnormal; Notable for the following:    RBC 3.75 (*)    Hemoglobin 11.6 (*)    HCT 35.5 (*)    All other components within normal limits  BASIC METABOLIC PANEL  TSH  T4, FREE  I-STAT TROPOININ, ED  Randolm Idol, ED    Imaging Review Dg Chest 2 View  06/23/2014   CLINICAL DATA:  Right-sided chest pain for 2 days.  EXAM: CHEST  2 VIEW  COMPARISON:  None.  FINDINGS: The cardiomediastinal contours are normal. The lungs are clear. Pulmonary vasculature is normal. No consolidation, pleural effusion, or pneumothorax. No acute osseous abnormalities are seen.  IMPRESSION: No acute pulmonary process.   Electronically Signed   By: Jeb Levering M.D.   On: 06/23/2014 01:09     EKG Interpretation   Date/Time:  Friday June 23 2014 02:59:51 EST Ventricular Rate:  70 PR Interval:  169 QRS Duration: 84 QT Interval:  368 QTC Calculation: 397 R Axis:   59 Text Interpretation:  Normal sinus rhythm new TWI V3 Confirmed by Glynn Octave [phone]) on 06/23/2014 3:28:14 AM      MDM   Final diagnoses:  None    Patient presents to the ED for HTN.  This in itself is not concerning, however the patient was symptomatic with chest pain.  It is low risk and it was in the setting of recently working out.  However she states it is intermittent, not  exertional, no SOB, vomiting, or diaphoresis.  Will evaluate with heart score and trop x 2.  Patient has a new flipped T waves in lead V3, with a history of high blood pressure and chest pain she'll need to be admitted for ACS. Patient is admitted to [facility], telemetry unit.  I personally performed the services described in this documentation, which was scribed in my presence. The recorded information has been reviewed and is accurate.   Everlene Balls, MD 06/23/14 [phone]

## 2014-06-23 ENCOUNTER — Emergency Department (HOSPITAL_COMMUNITY): Payer: PRIVATE HEALTH INSURANCE

## 2014-06-23 DIAGNOSIS — R030 Elevated blood-pressure reading, without diagnosis of hypertension: Secondary | ICD-10-CM

## 2014-06-23 DIAGNOSIS — R0782 Intercostal pain: Secondary | ICD-10-CM

## 2014-06-23 DIAGNOSIS — H538 Other visual disturbances: Secondary | ICD-10-CM | POA: Diagnosis present

## 2014-06-23 DIAGNOSIS — E038 Other specified hypothyroidism: Secondary | ICD-10-CM

## 2014-06-23 DIAGNOSIS — R079 Chest pain, unspecified: Secondary | ICD-10-CM

## 2014-06-23 DIAGNOSIS — E039 Hypothyroidism, unspecified: Secondary | ICD-10-CM | POA: Diagnosis present

## 2014-06-23 DIAGNOSIS — IMO0001 Reserved for inherently not codable concepts without codable children: Secondary | ICD-10-CM | POA: Diagnosis present

## 2014-06-23 LAB — RAPID URINE DRUG SCREEN, HOSP PERFORMED
Amphetamines: NOT DETECTED
BARBITURATES: NOT DETECTED
Benzodiazepines: NOT DETECTED
COCAINE: NOT DETECTED
Opiates: NOT DETECTED
Tetrahydrocannabinol: NOT DETECTED

## 2014-06-23 LAB — CBC
HEMATOCRIT: 33.1 % — AB (ref 36.0–46.0)
Hemoglobin: 10.9 g/dL — ABNORMAL LOW (ref 12.0–15.0)
MCH: 31.3 pg (ref 26.0–34.0)
MCHC: 32.9 g/dL (ref 30.0–36.0)
MCV: 95.1 fL (ref 78.0–100.0)
Platelets: 183 10*3/uL (ref 150–400)
RBC: 3.48 MIL/uL — ABNORMAL LOW (ref 3.87–5.11)
RDW: 13.6 % (ref 11.5–15.5)
WBC: 3.4 10*3/uL — ABNORMAL LOW (ref 4.0–10.5)

## 2014-06-23 LAB — CBC WITH DIFFERENTIAL/PLATELET
Basophils Absolute: 0 10*3/uL (ref 0.0–0.1)
Basophils Relative: 0 % (ref 0–1)
Eosinophils Absolute: 0 10*3/uL (ref 0.0–0.7)
Eosinophils Relative: 0 % (ref 0–5)
HEMATOCRIT: 35.5 % — AB (ref 36.0–46.0)
HEMOGLOBIN: 11.6 g/dL — AB (ref 12.0–15.0)
LYMPHS PCT: 43 % (ref 12–46)
Lymphs Abs: 1.9 10*3/uL (ref 0.7–4.0)
MCH: 30.9 pg (ref 26.0–34.0)
MCHC: 32.7 g/dL (ref 30.0–36.0)
MCV: 94.7 fL (ref 78.0–100.0)
Monocytes Absolute: 0.3 10*3/uL (ref 0.1–1.0)
Monocytes Relative: 6 % (ref 3–12)
NEUTROS PCT: 51 % (ref 43–77)
Neutro Abs: 2.3 10*3/uL (ref 1.7–7.7)
Platelets: 185 10*3/uL (ref 150–400)
RBC: 3.75 MIL/uL — AB (ref 3.87–5.11)
RDW: 13.6 % (ref 11.5–15.5)
WBC: 4.5 10*3/uL (ref 4.0–10.5)

## 2014-06-23 LAB — BASIC METABOLIC PANEL
Anion gap: 5 (ref 5–15)
Anion gap: 6 (ref 5–15)
BUN: 6 mg/dL (ref 6–23)
CALCIUM: 8.8 mg/dL (ref 8.4–10.5)
CHLORIDE: 109 meq/L (ref 96–112)
CO2: 26 mmol/L (ref 19–32)
CO2: 26 mmol/L (ref 19–32)
CREATININE: 0.65 mg/dL (ref 0.50–1.10)
Calcium: 9.3 mg/dL (ref 8.4–10.5)
Chloride: 106 mEq/L (ref 96–112)
Creatinine, Ser: 0.7 mg/dL (ref 0.50–1.10)
GFR calc Af Amer: >90 mL/min (ref >90)
GFR calc Af Amer: >90 mL/min (ref >90)
GFR calc non Af Amer: >90 mL/min (ref >90)
GLUCOSE: 92 mg/dL (ref 70–99)
Glucose, Bld: 84 mg/dL (ref 70–99)
POTASSIUM: 3.5 mmol/L (ref 3.5–5.1)
POTASSIUM: 3.6 mmol/L (ref 3.5–5.1)
SODIUM: 138 mmol/L (ref 135–145)
SODIUM: 140 mmol/L (ref 135–145)

## 2014-06-23 LAB — HEPATIC FUNCTION PANEL
ALT: 14 U/L (ref 0–35)
AST: 19 U/L (ref 0–37)
Albumin: 3.3 g/dL — ABNORMAL LOW (ref 3.5–5.2)
Alkaline Phosphatase: 47 U/L (ref 39–117)
Total Bilirubin: 0.5 mg/dL (ref 0.3–1.2)
Total Protein: 6 g/dL (ref 6.0–8.3)

## 2014-06-23 LAB — TROPONIN I: Troponin I: <0.03 ng/mL (ref <0.031)

## 2014-06-23 LAB — LIPID PANEL
Cholesterol: 222 mg/dL — ABNORMAL HIGH (ref 0–200)
HDL: 103 mg/dL (ref >39)
LDL CALC: 114 mg/dL — AB (ref 0–99)
Total CHOL/HDL Ratio: 2.2 RATIO
Triglycerides: 27 mg/dL (ref <150)
VLDL: 5 mg/dL (ref 0–40)

## 2014-06-23 LAB — HEMOGLOBIN A1C
Hgb A1c MFr Bld: 5.2 % (ref <5.7)
Mean Plasma Glucose: 103 mg/dL (ref <117)

## 2014-06-23 LAB — HCG, QUANTITATIVE, PREGNANCY: hCG, Beta Chain, Quant, S: 2 m[IU]/mL (ref <5)

## 2014-06-23 LAB — PROTIME-INR
INR: 1.02 (ref 0.00–1.49)
Prothrombin Time: 13.5 seconds (ref 11.6–15.2)

## 2014-06-23 LAB — TSH: TSH: 1.234 u[IU]/mL (ref 0.350–4.500)

## 2014-06-23 LAB — I-STAT TROPONIN, ED: Troponin i, poc: 0 ng/mL (ref 0.00–0.08)

## 2014-06-23 LAB — T4, FREE: FREE T4: 1.41 ng/dL (ref 0.80–1.80)

## 2014-06-23 MED ORDER — SODIUM CHLORIDE 0.9 % IJ SOLN: 3.0000 mL | Freq: Two times a day (BID) | INTRAMUSCULAR | Status: DC

## 2014-06-23 MED ORDER — VITAMIN D3 25 MCG (1000 UNIT) PO TABS
1000.0000 [IU] | ORAL_TABLET | Freq: Every day | ORAL | Status: DC
  Administered 2014-06-23: 1000 [IU] via ORAL
  Filled 2014-06-23 (×2): qty 1

## 2014-06-23 MED ORDER — SODIUM CHLORIDE 0.9 % IV SOLN
INTRAVENOUS | Status: DC
  Administered 2014-06-23: 10:00:00 via INTRAVENOUS

## 2014-06-23 MED ORDER — LISINOPRIL 10 MG PO TABS: 10.0000 mg | ORAL_TABLET | Freq: Every day | ORAL | Status: DC

## 2014-06-23 MED ORDER — ADULT MULTIVITAMIN W/MINERALS CH
1.0000 | ORAL_TABLET | Freq: Every day | ORAL | Status: DC
  Administered 2014-06-23: 1 via ORAL
  Filled 2014-06-23: qty 1

## 2014-06-23 MED ORDER — ATORVASTATIN CALCIUM 40 MG PO TABS: 40.0000 mg | ORAL_TABLET | Freq: Every day | ORAL | Status: DC

## 2014-06-23 MED ORDER — HEPARIN SODIUM (PORCINE) 5000 UNIT/ML IJ SOLN
5000.0000 [IU] | Freq: Three times a day (TID) | INTRAMUSCULAR | Status: DC
  Administered 2014-06-23: 5000 [IU] via SUBCUTANEOUS
  Filled 2014-06-23: qty 1

## 2014-06-23 MED ORDER — MORPHINE SULFATE 2 MG/ML IJ SOLN: 2.0000 mg | INTRAMUSCULAR | Status: DC | PRN

## 2014-06-23 MED ORDER — ASPIRIN 325 MG PO TABS
325.0000 mg | ORAL_TABLET | Freq: Every day | ORAL | Status: DC
  Administered 2014-06-23: 325 mg via ORAL
  Filled 2014-06-23: qty 1

## 2014-06-23 MED ORDER — NITROGLYCERIN 0.4 MG SL SUBL: 0.4000 mg | SUBLINGUAL_TABLET | SUBLINGUAL | Status: DC | PRN

## 2014-06-23 MED ORDER — ATORVASTATIN CALCIUM 10 MG PO TABS: 10.0000 mg | ORAL_TABLET | Freq: Every day | ORAL | Status: DC

## 2014-06-23 MED ORDER — LEVOTHYROXINE SODIUM 25 MCG PO TABS
125.0000 ug | ORAL_TABLET | Freq: Every day | ORAL | Status: DC
  Administered 2014-06-23: 125 ug via ORAL
  Filled 2014-06-23 (×2): qty 1

## 2014-06-23 MED ORDER — HYDRALAZINE HCL 20 MG/ML IJ SOLN: 10.0000 mg | Freq: Four times a day (QID) | INTRAMUSCULAR | Status: DC | PRN

## 2014-06-23 NOTE — ED Notes (Signed)
Disregard note at [phone]; documented on wrong pt

## 2014-06-23 NOTE — ED Notes (Signed)
Patient transported to X-ray 

## 2014-06-23 NOTE — Discharge Instructions (Signed)
You have been started on Lisinopril for blood pressure and Lipitor for high cholesterol.  Please see your primary care physician in the next 1 week to check your blood pressure.  You will need blood work done in the next month to check your kidney and liver function due to your new medications.  Best wishes.   PS - Sometimes a heating pad help with the shoulder discomfort.

## 2014-06-23 NOTE — Discharge Summary (Signed)
Physician Discharge Summary  Carol [name] ZOX:[id number] DOB: [birth year] DOA: 06/22/2014  PCP: Eli Hose, MD  Admit date: 06/22/2014 Discharge date: 06/23/2014  Time spent: 30 minutes  Recommendations for Outpatient Follow-up:  1. Establish with a primary care physician. Please follow blood pressure. Patient started on lisinopril this admission. 2. Cholesterol slightly high. Patient started on Lipitor this admission 3. Ms. Carol [name] was admitted with EKG changes and chest pain.  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 She has a past medical history of hypothyroidism. She was admitted to [name] after having intermittent episodes of right upper chest pain. She also had a headache with eye heaviness and an inability to focus the day prior to admission. In the ER her EKG showed flipped T waves that were not present on prior EKG. Further, her blood pressure was elevated at 174/104.  Hospital Course:   Chest pain Mrs. Carol [name] was admitted for ACS rule out. Her troponins were negative. Chest pain appeared to be musculoskeletal. The patient reports that she recently started doing pushups. On exam the patient appears healthy. No further w/up needed and should follow up with PCP 9 reports having an appt at [facility] in [place] next week)  Hypertension The patient's systolic blood pressure decreased during her short stay. On discharge it was 139/88. She was discharged with a prescription for low-dose lisinopril. Currently her renal function is normal with a creatinine of 0.65. Please follow renal  function.  Hyperlipidemia As part of the ACS workup the patient's lipid panel was checked. She was prescribed low-dose Lipitor at the time of discharge. Her LFTs this admission were normal. Please follow LFTs.  Results for Carol [name] (MRN [id number]) as of 06/23/2014 16:03  Ref. Range 06/23/2014 06:33  Cholesterol Latest Range: 0-200 mg/dL 222 (H)  Triglycerides Latest Range: <150 mg/dL 27  HDL Latest Range: >39 mg/dL 103  LDL (calc) Latest Range: 0-99 mg/dL 114 (H)  VLDL Latest Range: 0-40 mg/dL 5  Total CHOL/HDL Ratio Latest Units: RATIO 2.2    Procedures:  None  Consultations:  None  Discharge Exam: Filed Vitals:   06/23/14 0800 06/23/14 0811 06/23/14 0857 06/23/14 1537  BP: 135/81 135/81 151/92 139/88  Pulse: 72 69 65 70  Temp:   98.6 F (37 C) 98.7 F (37.1 C)  TempSrc:   Oral Oral  Resp: 19 19 16 14   Height:   5\' 5"  (1.651 m)   Weight:   85.4 kg (188 lb 4.4 oz)   SpO2: 98% 98% 99% 100%   Gen.: Well-developed well-nourished very pleasant African American female lying comfortably in bed. Cardiovascular: Regular rate and rhythm, without murmur rub or gallop, no JVD. Respiratory: Clear to auscultation bilaterally. No wheezes crackles or rales Abdomen: Soft, nontender, nondistended, no masses, positive bowel sounds  Extremities: 5 over 5 strength in each, patient with no lower extremity edema.   Discharge Instructions   Discharge Instructions    Diet - low sodium heart healthy    Complete by:  As directed      Increase activity slowly    Complete by:  As directed           Current Discharge Medication List    START taking these medications   Details  atorvastatin (LIPITOR) 10 MG tablet Take 1 tablet (10 mg total) by mouth daily. Qty: 30 tablet, Refills: 3    lisinopril (PRINIVIL) 10 MG tablet Take 1 tablet (10 mg total) by mouth daily. Qty: 30 tablet, Refills: 0      CONTINUE these medications which have NOT CHANGED   Details  Cholecalciferol  (VITAMIN D3 PO) Take 1 tablet by mouth daily.    levothyroxine (SYNTHROID, LEVOTHROID) 125 MCG tablet Take 125 mcg by mouth daily before breakfast.    Multiple Vitamin (MULTIVITAMIN WITH MINERALS) TABS tablet Take 1 tablet by mouth daily.    clotrimazole-betamethasone (LOTRISONE) cream Apply to affected area 2 times daily prn Qty: 15 g, Refills: 0    fexofenadine (ALLEGRA) 180 MG tablet Take 1 tablet (180 mg total) by mouth daily. Qty: 30 tablet, Refills: 1    fluticasone (FLONASE) 50 MCG/ACT nasal spray Place 1 spray into the nose 2 (two) times daily. Qty: 1 g, Refills: 2       Allergies  Allergen Reactions  . Shellfish Allergy Hives    Crab    Follow-up Information    Please follow up.   Why:  Please schedule an appointment with your new primary care provider to check your blood pressure and cholesterol       The results of significant diagnostics from this hospitalization (including imaging, microbiology, ancillary and laboratory) are listed below for reference.    Significant Diagnostic Studies: Dg Chest 2 View  06/23/2014   CLINICAL DATA:  Right-sided chest pain for 2 days.  EXAM: CHEST  2 VIEW  COMPARISON:  None.  FINDINGS: The cardiomediastinal contours are normal. The lungs are clear. Pulmonary vasculature is normal. No consolidation, pleural effusion, or pneumothorax. No acute osseous abnormalities are seen.  IMPRESSION: No acute pulmonary process.   Electronically Signed   By: Jeb Levering M.D.   On: 06/23/2014 01:09     Labs: Basic Metabolic Panel:  Recent Labs Lab 06/22/14 2352 06/23/14 0632  NA 138 140  K 3.6 3.5  CL 106 109  CO2 26 26  GLUCOSE 92 84  BUN 6 <5*  CREATININE 0.70 0.65  CALCIUM 9.3 8.8   Liver Function Tests:  Recent Labs Lab 06/23/14 0633  AST 19  ALT 14  ALKPHOS 47  BILITOT 0.5  PROT 6.0  ALBUMIN 3.3*   CBC:  Recent Labs Lab 06/22/14 2352 06/23/14 0632  WBC 4.5 3.4*  NEUTROABS 2.3  --   HGB 11.6* 10.9*  HCT  35.5* 33.1*  MCV 94.7 95.1  PLT 185 183   Cardiac Enzymes:  Recent Labs Lab 06/23/14 0632 06/23/14 1200  TROPONINI <0.03 <0.03      Signed:  Melton Alar, PA-C  [facility] 06/23/2014, 4:10 PM

## 2014-06-23 NOTE — ED Notes (Signed)
MD at bedside. 

## 2014-06-23 NOTE — H&P (Signed)
[facility] History and Physical  Carol [name] GBE:[id number] DOB: [birth year] DOA: 06/22/2014  Referring physician: ED physician PCP: Eli Hose, MD  Specialists:   Chief Complaint: Chest pain  HPI: Carol [name] is a 49 y.o. female with a past medical history of hypothyroidism, who presents with chest pain.  Patient reports that yesterday morning at about 3 AM, she woke up with headache, which resolved after she took a ibuprofen pill. She went to work. In working place, she had feeling of "eye heaveness" and could not focus her eyes. She states that was not blurry vision. The symptoms lasted for about 30 minutes, then resolved spontaneously. She did not have unilateral weakness, numbness or tingling sensations in her extremities. She went to the urgent care and had negative evaluation and was discharged home.  Patient also reports that in the last week, she has been having intermittent chest pain. The chest pain is located in the right upper chest. Happened totally 6 times, each times lasted for about 5 minutes. It is 7 out of 10 in severity, nonradiating. She stated that this may be related to the muscle stretching because she is doing exercise. Chest pain is not pleuritic. It is not aggravated by deep breath. Patient does not have cough, fever or chills. Patient denies fever, chills, fatigue, cough, SOB, abdominal pain, diarrhea, constipation, dysuria, urgency, frequency, hematuria, skin rashes, joint pain or leg swelling.  Work up in the ED demonstrates elevated blood pressure at 174/104, negative troponin, no leukocytosis, negative chest x-ray for acute abnormalities. ekg showed T-wave inversion in V3 and flattening in V4 to V6. Patient is admitted to inpatient for further evaluation and treatment.  Review of Systems: As presented in the history of presenting illness, rest negative.  Where does patient live?  At home Can patient participate in ADLs? Yes  Allergy:   Allergies  Allergen Reactions  . Shellfish Allergy Hives    Crab     Past Medical History  Diagnosis Date  . Thyroid disease   . Hypothyroidism     Past Surgical History  Procedure Laterality Date  . Cesarean section    . Wrist surgery Right 1994    Social History:  reports that she has never smoked. She does not have any smokeless tobacco history on file. She reports that she does not drink alcohol or use illicit drugs.  Family History: No family history on file.   Prior to Admission medications   Medication Sig Start Date End Date Taking? Authorizing Provider  clotrimazole-betamethasone (LOTRISONE) cream Apply to affected area 2 times daily prn 12/04/12   Janne Napoleon, NP  fexofenadine (ALLEGRA) 180 MG tablet Take 1 tablet (180 mg total) by mouth daily. 08/01/12   Billy Fischer, MD  fluticasone (FLONASE) 50 MCG/ACT nasal spray Place 1 spray into the nose 2 (two) times daily. 08/01/12   Billy Fischer, MD  levothyroxine (SYNTHROID, LEVOTHROID) 125 MCG tablet Take 125 mcg by mouth daily before breakfast.    Historical Provider, MD    Physical Exam: Filed Vitals:   06/23/14 0215 06/23/14 0300 06/23/14 0330 06/23/14 0400  BP: 127/77 128/78 112/73 123/82  Pulse: 64 68 70 70  Temp:      TempSrc:      Resp: 14 22 17 17   SpO2: 98% 100% 98% 100%   General: Not in acute distress HEENT:       Eyes: PERRL, EOMI, no scleral icterus       ENT: No discharge from  the ears and nose, no pharynx injection, no tonsillar enlargement.        Neck: No JVD, no bruit, no mass felt. Cardiac: S1/S2, RRR, No murmurs, No gallops or rubs Pulm: Good air movement bilaterally. Clear to auscultation bilaterally. No rales, wheezing, rhonchi or rubs. Abd: Soft, nondistended, nontender, no rebound pain, no organomegaly, BS present Ext: No edema bilaterally. 2+DP/PT pulse bilaterally Musculoskeletal: No joint deformities, erythema, or stiffness, ROM full Skin: No rashes.  Neuro: Alert and oriented X3,  cranial nerves II-XII grossly intact, muscle strength 5/5 in all extremeties, sensation to light touch intact. Brachial reflex 2+ bilaterally. Knee reflex 1+ bilaterally. Negative Babinski's sign. Normal finger to nose test. Psych: Patient is not psychotic, no suicidal or hemocidal ideation.  Labs on Admission:  Basic Metabolic Panel:  Recent Labs Lab 06/22/14 2352  NA 138  K 3.6  CL 106  CO2 26  GLUCOSE 92  BUN 6  CREATININE 0.70  CALCIUM 9.3   Liver Function Tests: No results for input(s): AST, ALT, ALKPHOS, BILITOT, PROT, ALBUMIN in the last 168 hours. No results for input(s): LIPASE, AMYLASE in the last 168 hours. No results for input(s): AMMONIA in the last 168 hours. CBC:  Recent Labs Lab 06/22/14 2352  WBC 4.5  NEUTROABS 2.3  HGB 11.6*  HCT 35.5*  MCV 94.7  PLT 185   Cardiac Enzymes: No results for input(s): CKTOTAL, CKMB, CKMBINDEX, TROPONINI in the last 168 hours.  BNP (last 3 results) No results for input(s): PROBNP in the last 8760 hours. CBG: No results for input(s): GLUCAP in the last 168 hours.  Radiological Exams on Admission: Dg Chest 2 View  06/23/2014   CLINICAL DATA:  Right-sided chest pain for 2 days.  EXAM: CHEST  2 VIEW  COMPARISON:  None.  FINDINGS: The cardiomediastinal contours are normal. The lungs are clear. Pulmonary vasculature is normal. No consolidation, pleural effusion, or pneumothorax. No acute osseous abnormalities are seen.  IMPRESSION: No acute pulmonary process.   Electronically Signed   By: Jeb Levering M.D.   On: 06/23/2014 01:09    EKG: Independently reviewed. T-wave inversion in V3 and flattening in V4 to V6.  Assessment/Plan Principal Problem:   Chest pain Active Problems:   Hypothyroidism   Elevated blood pressure   Blurry vision   Chest pain: Chest x-ray is negative for pneumonia. No risk factors for pulmonary embolism. Need to rule out ACS given abnormal EKG -will admit to Tele bed  - cycle CE q6 x3 and  repeat her EKG in the am  - Nitroglycerin, Morphine, and aspirin, lipitor  - Risk factor stratification: will check FLP and A1C  - Consider cardiology consult if test positive for CEs  - NPO and IVF - check UDS - 2d echo  Elevated blood pressure: She presented with blood pressure 174/104. Patient does not carry the diagnosis of hypertension. She is not on medications. Currently blood pressure is 123/82 when I saw patient in ED. -will observe closely -may start low dose of ACEI if blood pressure elevates  Hypothyroidism:  TSH was 1.234 on 06/23/14 -Continue Synthroid  "Heaviness in eyes": Unclear etiology. Does not look like to have stroke or TIA. Patient does not have any signs of weakness, numbness or tingling sensations in her extremities. Symptom has completely resolved -observe closely, if happens again, will get MRI to r/o stroke  DVT ppx: SQ Heparin        Code Status: Full code Family Communication: None at bed side.  Disposition Plan: Admit to inpatient   Date of Service 06/23/2014    Ivor Costa [facility] Pager [phone]  If 7PM-7AM, please contact night-coverage [email or web address] Password [facility] 06/23/2014, 5:16 AM

## 2014-10-20 ENCOUNTER — Emergency Department (HOSPITAL_COMMUNITY)
Admission: EM | Admit: 2014-10-20 | Discharge: 2014-10-20 | Disposition: A | Discharge status: Home / Self Care | Payer: PRIVATE HEALTH INSURANCE | Source: Home / Self Care | Attending: Family Medicine | Admitting: Family Medicine

## 2014-10-20 ENCOUNTER — Encounter (HOSPITAL_COMMUNITY): Payer: Self-pay | Admitting: Emergency Medicine

## 2014-10-20 DIAGNOSIS — K2100 Gastro-esophageal reflux disease with esophagitis, without bleeding: Secondary | ICD-10-CM

## 2014-10-20 MED ORDER — GI COCKTAIL ~~LOC~~
ORAL | Status: AC
  Filled 2014-10-20: qty 30

## 2014-10-20 MED ORDER — OMEPRAZOLE 40 MG PO CPDR
40.0000 mg | DELAYED_RELEASE_CAPSULE | Freq: Every day | ORAL | Status: DC
Start: 2014-10-20 — End: 2016-02-19

## 2014-10-20 MED ORDER — GI COCKTAIL ~~LOC~~
30.0000 mL | Freq: Once | ORAL | Status: AC
  Administered 2014-10-20: 30 mL via ORAL

## 2014-10-20 NOTE — ED Provider Notes (Signed)
Carol [name] is a 49 y.o. female who presents to Urgent Care today for pain radiating from her right breast to her neck present for about one day developed after vomiting. No fevers or chills abdominal pain or diarrhea. She no longer has any vomiting. She's tried aspirin which did not help. Symptoms are worse when she bends forward to the right.   Past Medical History  Diagnosis Date  . Thyroid disease   . Hypothyroidism    Past Surgical History  Procedure Laterality Date  . Cesarean section    . Wrist surgery Right 1994   History  Substance Use Topics  . Smoking status: Never Smoker   . Smokeless tobacco: Not on file  . Alcohol Use: No   ROS as above Medications: Current Facility-Administered Medications  Medication Dose Route Frequency Provider Last Rate Last Dose  . gi cocktail (Maalox,Lidocaine,Donnatal)  30 mL Oral Once Gregor Hams, MD       Current Outpatient Prescriptions  Medication Sig Dispense Refill  . atorvastatin (LIPITOR) 10 MG tablet Take 1 tablet (10 mg total) by mouth daily. 30 tablet 3  . levothyroxine (SYNTHROID, LEVOTHROID) 125 MCG tablet Take 125 mcg by mouth daily before breakfast.    . Cholecalciferol (VITAMIN D3 PO) Take 1 tablet by mouth daily.    . clotrimazole-betamethasone (LOTRISONE) cream Apply to affected area 2 times daily prn (Patient not taking: Reported on 06/23/2014) 15 g 0  . fexofenadine (ALLEGRA) 180 MG tablet Take 1 tablet (180 mg total) by mouth daily. (Patient not taking: Reported on 06/23/2014) 30 tablet 1  . fluticasone (FLONASE) 50 MCG/ACT nasal spray Place 1 spray into the nose 2 (two) times daily. (Patient not taking: Reported on 06/23/2014) 1 g 2  . lisinopril (PRINIVIL) 10 MG tablet Take 1 tablet (10 mg total) by mouth daily. 30 tablet 0  . Multiple Vitamin (MULTIVITAMIN WITH MINERALS) TABS tablet Take 1 tablet by mouth daily.     Allergies  Allergen Reactions  . Shellfish Allergy Hives    Crab      Exam:  BP 107/73 mmHg   Pulse 79  Temp(Src) 98.8 F (37.1 C) (Oral)  Resp 16  SpO2 100%  LMP 09/26/2014 Gen: Well NAD HEENT: EOMI,  MMM normal oropharynx Lungs: Normal work of breathing. CTABL Heart: RRR no MRG Chest wall is nontender Abd: NABS, Soft. Nondistended, Nontender Exts: Brisk capillary refill, warm and well perfused.  Breasts right breast normal-appearing nontender with no significant masses  Patient was given a GI cocktail and had complete resolution of symptoms  No results found for this or any previous visit (from the past 24 hour(s)). No results found.  Assessment and Plan: 49 y.o. female with esophagitis or esophageal reflux. She with omeprazole and follow-up with PCP.  Discussed warning signs or symptoms. Please see discharge instructions. Patient expresses understanding.     Gregor Hams, MD 10/20/14 [phone]

## 2014-10-20 NOTE — ED Notes (Signed)
C/o right breast pain onset 5/12; pain increases when she lays on right side Denies fevers, chills, inj/trauma Alert, no signs of acute distress.

## 2014-10-20 NOTE — Discharge Instructions (Signed)
Thank you for coming in today. If your belly pain worsens, or you have high fever, bad vomiting, blood in your stool or black tarry stool go to the Emergency Room.  Call or go to the emergency room if you get worse, have trouble breathing, have chest pains, or palpitations.    Esophagitis Esophagitis is inflammation of the esophagus. It can involve swelling, soreness, and pain in the esophagus. This condition can make it difficult and painful to swallow. CAUSES  Most causes of esophagitis are not serious. Many different factors can cause esophagitis, including:  Gastroesophageal reflux disease (GERD). This is when acid from your stomach flows up into the esophagus.  Recurrent vomiting.  An allergic-type reaction.  Certain medicines, especially those that come in large pills.  Ingestion of harmful chemicals, such as household cleaning products.  Heavy alcohol use.  An infection of the esophagus.  Radiation treatment for cancer.  Certain diseases such as sarcoidosis, Crohn's disease, and scleroderma. These diseases may cause recurrent esophagitis. SYMPTOMS   Trouble swallowing.  Painful swallowing.  Chest pain.  Difficulty breathing.  Nausea.  Vomiting.  Abdominal pain. DIAGNOSIS  Your caregiver will take your history and do a physical exam. Depending upon what your caregiver finds, certain tests may also be done, including:  Barium X-ray. You will drink a solution that coats the esophagus, and X-rays will be taken.  Endoscopy. A lighted tube is put down the esophagus so your caregiver can examine the area.  Allergy tests. These can sometimes be arranged through follow-up visits. TREATMENT  Treatment will depend on the cause of your esophagitis. In some cases, steroids or other medicines may be given to help relieve your symptoms or to treat the underlying cause of your condition. Medicines that may be recommended include:  Viscous lidocaine, to soothe the  esophagus.  Antacids.  Acid reducers.  Proton pump inhibitors.  Antiviral medicines for certain viral infections of the esophagus.  Antifungal medicines for certain fungal infections of the esophagus.  Antibiotic medicines, depending on the cause of the esophagitis. HOME CARE INSTRUCTIONS   Avoid foods and drinks that seem to make your symptoms worse.  Eat small, frequent meals instead of large meals.  Avoid eating for the 3 hours prior to your bedtime.  If you have trouble taking pills, use a pill splitter to decrease the size and likelihood of the pill getting stuck or injuring the esophagus on the way down. Drinking water after taking a pill also helps.  Stop smoking if you smoke.  Maintain a healthy weight.  Wear loose-fitting clothing. Do not wear anything tight around your waist that causes pressure on your stomach.  Raise the head of your bed 6 to 8 inches with wood blocks to help you sleep. Extra pillows will not help.  Only take over-the-counter or prescription medicines as directed by your caregiver. SEEK IMMEDIATE MEDICAL CARE IF:  You have severe chest pain that radiates into your arm, neck, or jaw.  You feel sweaty, dizzy, or lightheaded.  You have shortness of breath.  You vomit blood.  You have difficulty or pain with swallowing.  You have bloody or black, tarry stools.  You have a fever.  You have a burning sensation in the chest more than 3 times a week for more than 2 weeks.  You cannot swallow, drink, or eat.  You drool because you cannot swallow your saliva. MAKE SURE YOU:  Understand these instructions.  Will watch your condition.  Will get help right  away if you are not doing well or get worse. Document Released: 07/03/2004 Document Revised: 08/18/2011 Document Reviewed: 01/24/2011 [facility] Patient Information 2015 [place], [place]. This information is not intended to replace advice given to you by your health care provider. Make sure  you discuss any questions you have with your health care provider.

## 2014-11-09 ENCOUNTER — Encounter (HOSPITAL_COMMUNITY): Payer: Self-pay | Admitting: *Deleted

## 2014-11-09 NOTE — Telephone Encounter (Signed)
Opened in error

## 2015-03-24 ENCOUNTER — Encounter (HOSPITAL_COMMUNITY): Payer: Self-pay | Admitting: Emergency Medicine

## 2015-03-24 ENCOUNTER — Emergency Department (HOSPITAL_COMMUNITY)
Admission: EM | Admit: 2015-03-24 | Discharge: 2015-03-24 | Disposition: A | Discharge status: Home / Self Care | Payer: PRIVATE HEALTH INSURANCE | Attending: Emergency Medicine | Admitting: Emergency Medicine

## 2015-03-24 DIAGNOSIS — Z79899 Other long term (current) drug therapy: Secondary | ICD-10-CM | POA: Diagnosis not present

## 2015-03-24 DIAGNOSIS — J019 Acute sinusitis, unspecified: Secondary | ICD-10-CM | POA: Insufficient documentation

## 2015-03-24 DIAGNOSIS — H6121 Impacted cerumen, right ear: Secondary | ICD-10-CM | POA: Diagnosis not present

## 2015-03-24 DIAGNOSIS — E039 Hypothyroidism, unspecified: Secondary | ICD-10-CM | POA: Insufficient documentation

## 2015-03-24 DIAGNOSIS — J029 Acute pharyngitis, unspecified: Secondary | ICD-10-CM | POA: Insufficient documentation

## 2015-03-24 LAB — RAPID STREP SCREEN (MED CTR MEBANE ONLY): Streptococcus, Group A Screen (Direct): NEGATIVE

## 2015-03-24 MED ORDER — IBUPROFEN 200 MG PO TABS
600.0000 mg | ORAL_TABLET | Freq: Once | ORAL | Status: AC
  Administered 2015-03-24: 600 mg via ORAL
  Filled 2015-03-24: qty 3

## 2015-03-24 NOTE — ED Provider Notes (Signed)
CSN: [id number]     Arrival date & time 03/24/15  0350 History   First MD Initiated Contact with Patient 03/24/15 [phone]     Chief Complaint  Patient presents with  . Sore Throat     (Consider location/radiation/quality/duration/timing/severity/associated sxs/prior Treatment) HPI Comments: Patient states last night prior to falling asleep that her right ear was painful.  She was able to sleep but was awakened several hours later with a sensation of being unable to.  She does have a history of allergies to seafood, so she took Benadryl and this is slowly getting better.  Denies any shortness of breath, hives, lip swelling, tongue swelling.  Denies eating any shellfish.  Patient is a 49 y.o. female presenting with pharyngitis. The history is provided by the patient.  Sore Throat This is a new problem. The current episode started today. The problem occurs constantly. The problem has been gradually improving. Pertinent negatives include no congestion, fever, rash, sore throat or swollen glands. Nothing aggravates the symptoms. She has tried nothing for the symptoms. The treatment provided mild relief.    Past Medical History  Diagnosis Date  . Thyroid disease   . Hypothyroidism    Past Surgical History  Procedure Laterality Date  . Cesarean section    . Wrist surgery Right 1994   History reviewed. No pertinent family history. Social History  Substance Use Topics  . Smoking status: Never Smoker   . Smokeless tobacco: None  . Alcohol Use: No   OB History    No data available     Review of Systems  Constitutional: Negative for fever.  HENT: Positive for ear pain and trouble swallowing. Negative for congestion, dental problem, drooling, ear discharge, facial swelling, rhinorrhea, sinus pressure and sore throat.   Respiratory: Negative for shortness of breath.   Skin: Negative for rash.  All other systems reviewed and are negative.     Allergies  Shellfish allergy  Home  Medications   Prior to Admission medications   Medication Sig Start Date End Date Taking? Authorizing Provider  atorvastatin (LIPITOR) 10 MG tablet Take 1 tablet (10 mg total) by mouth daily. 06/23/14   Melton Alar, PA-C  Cholecalciferol (VITAMIN D3 PO) Take 1 tablet by mouth daily.    Historical Provider, MD  levothyroxine (SYNTHROID, LEVOTHROID) 125 MCG tablet Take 125 mcg by mouth daily before breakfast.    Historical Provider, MD  lisinopril (PRINIVIL) 10 MG tablet Take 1 tablet (10 mg total) by mouth daily. 06/23/14   Melton Alar, PA-C  Multiple Vitamin (MULTIVITAMIN WITH MINERALS) TABS tablet Take 1 tablet by mouth daily.    Historical Provider, MD  omeprazole (PRILOSEC) 40 MG capsule Take 1 capsule (40 mg total) by mouth daily. 10/20/14   Gregor Hams, MD   BP 112/75 mmHg  Pulse 69  Temp(Src) 98.1 F (36.7 C) (Oral)  Resp 17  Ht 5\' 5"  (1.651 m)  Wt 173 lb (78.472 kg)  BMI 28.79 kg/m2  SpO2 97%  LMP 01/22/2015 Physical Exam  Constitutional: She appears well-developed and well-nourished.  HENT:  Head: Normocephalic.  Right Ear: No drainage, swelling or tenderness.  Left Ear: External ear normal.  Mouth/Throat: Uvula is midline. No uvula swelling. No oropharyngeal exudate or posterior oropharyngeal edema.  Cerumen impaction R   Cardiovascular: Normal rate and regular rhythm.   Pulmonary/Chest: Effort normal and breath sounds normal.  Musculoskeletal: Normal range of motion.  Lymphadenopathy:    She has no cervical adenopathy.  Neurological: She  is alert.  Skin: Skin is warm. No rash noted.  Nursing note and vitals reviewed.   ED Course  Procedures (including critical care time) Labs Review Labs Reviewed  RAPID STREP SCREEN (NOT AT [facility])  CULTURE, GROUP A STREP    Imaging Review No results found. I have personally reviewed and evaluated these images and lab results as part of my medical decision-making.   EKG Interpretation None     Your was irrigated  and cerumen impaction removed.  Patient states this feels better.  She also states that her throat is feeling better after ibuprofen.  I feel that this is irritation from her sinus drainage rather than an allergic reaction. MDM   Final diagnoses:  Cerumen impaction, right  Pharyngitis  Acute sinusitis, recurrence not specified, unspecified location         Junius Creamer, NP 03/24/15 [id number]  Julianne Rice, MD 03/25/15 [phone]

## 2015-03-24 NOTE — Discharge Instructions (Signed)
Pharyngitis Pharyngitis is a sore throat (pharynx). There is redness, pain, and swelling of your throat. HOME CARE   Drink enough fluids to keep your pee (urine) clear or pale yellow.  Only take medicine as told by your doctor.  You may get sick again if you do not take medicine as told. Finish your medicines, even if you start to feel better.  Do not take aspirin.  Rest.  Rinse your mouth (gargle) with salt water ( tsp of salt per 1 qt of water) every 1-2 hours. This will help the pain.  If you are not at risk for choking, you can suck on hard candy or sore throat lozenges. GET HELP IF:  You have large, tender lumps on your neck.  You have a rash.  You cough up green, yellow-brown, or bloody spit. GET HELP RIGHT AWAY IF:   You have a stiff neck.  You drool or cannot swallow liquids.  You throw up (vomit) or are not able to keep medicine or liquids down.  You have very bad pain that does not go away with medicine.  You have problems breathing (not from a stuffy nose). MAKE SURE YOU:   Understand these instructions.  Will watch your condition.  Will get help right away if you are not doing well or get worse.   This information is not intended to replace advice given to you by your health care provider. Make sure you discuss any questions you have with your health care provider.   Document Released: 11/12/2007 Document Revised: 03/16/2013 Document Reviewed: 01/31/2013 Elsevier Interactive Patient Education 2016 [place] can try using over-the-counter Tylenol Sinus, Sudafed or phenylephrine to help with your postnasal drip

## 2015-03-24 NOTE — ED Notes (Signed)
Pt states she is having problem swallowing for 2 hours, states she started first with ear pain and then her throat start to swell and make her discomfort. Denies any fever or chills. Pt states she took a Benadryl at home.

## 2015-03-28 LAB — CULTURE, GROUP A STREP: STREP A CULTURE: NEGATIVE

## 2015-03-30 ENCOUNTER — Other Ambulatory Visit: Payer: Self-pay

## 2015-03-30 DIAGNOSIS — Z1231 Encounter for screening mammogram for malignant neoplasm of breast: Secondary | ICD-10-CM

## 2015-04-17 ENCOUNTER — Ambulatory Visit
Admission: RE | Admit: 2015-04-17 | Discharge: 2015-04-17 | Disposition: A | Discharge status: Home / Self Care | Payer: PRIVATE HEALTH INSURANCE | Source: Ambulatory Visit

## 2015-04-17 DIAGNOSIS — Z1231 Encounter for screening mammogram for malignant neoplasm of breast: Secondary | ICD-10-CM

## 2015-07-19 DIAGNOSIS — L501 Idiopathic urticaria: Secondary | ICD-10-CM | POA: Insufficient documentation

## 2015-08-02 DIAGNOSIS — L84 Corns and callosities: Secondary | ICD-10-CM | POA: Insufficient documentation

## 2015-08-02 DIAGNOSIS — K9049 Malabsorption due to intolerance, not elsewhere classified: Secondary | ICD-10-CM | POA: Insufficient documentation

## 2015-08-02 DIAGNOSIS — R059 Cough, unspecified: Secondary | ICD-10-CM | POA: Insufficient documentation

## 2015-08-02 DIAGNOSIS — R1012 Left upper quadrant pain: Secondary | ICD-10-CM

## 2015-08-02 DIAGNOSIS — R1011 Right upper quadrant pain: Secondary | ICD-10-CM | POA: Insufficient documentation

## 2015-08-02 DIAGNOSIS — I10 Essential (primary) hypertension: Secondary | ICD-10-CM | POA: Insufficient documentation

## 2015-08-02 DIAGNOSIS — J0141 Acute recurrent pansinusitis: Secondary | ICD-10-CM | POA: Insufficient documentation

## 2015-08-02 DIAGNOSIS — R05 Cough: Secondary | ICD-10-CM | POA: Insufficient documentation

## 2015-10-31 DIAGNOSIS — D7589 Other specified diseases of blood and blood-forming organs: Secondary | ICD-10-CM | POA: Insufficient documentation

## 2015-11-12 DIAGNOSIS — Z1211 Encounter for screening for malignant neoplasm of colon: Secondary | ICD-10-CM | POA: Insufficient documentation

## 2016-01-16 IMAGING — DX DG CHEST 2V
2 series · 2 of 2 positions shown · non-contrast
Comparison: None.

CLINICAL DATA: Right-sided chest pain for 2 days.

EXAM:
CHEST  2 VIEW

[chest pa]
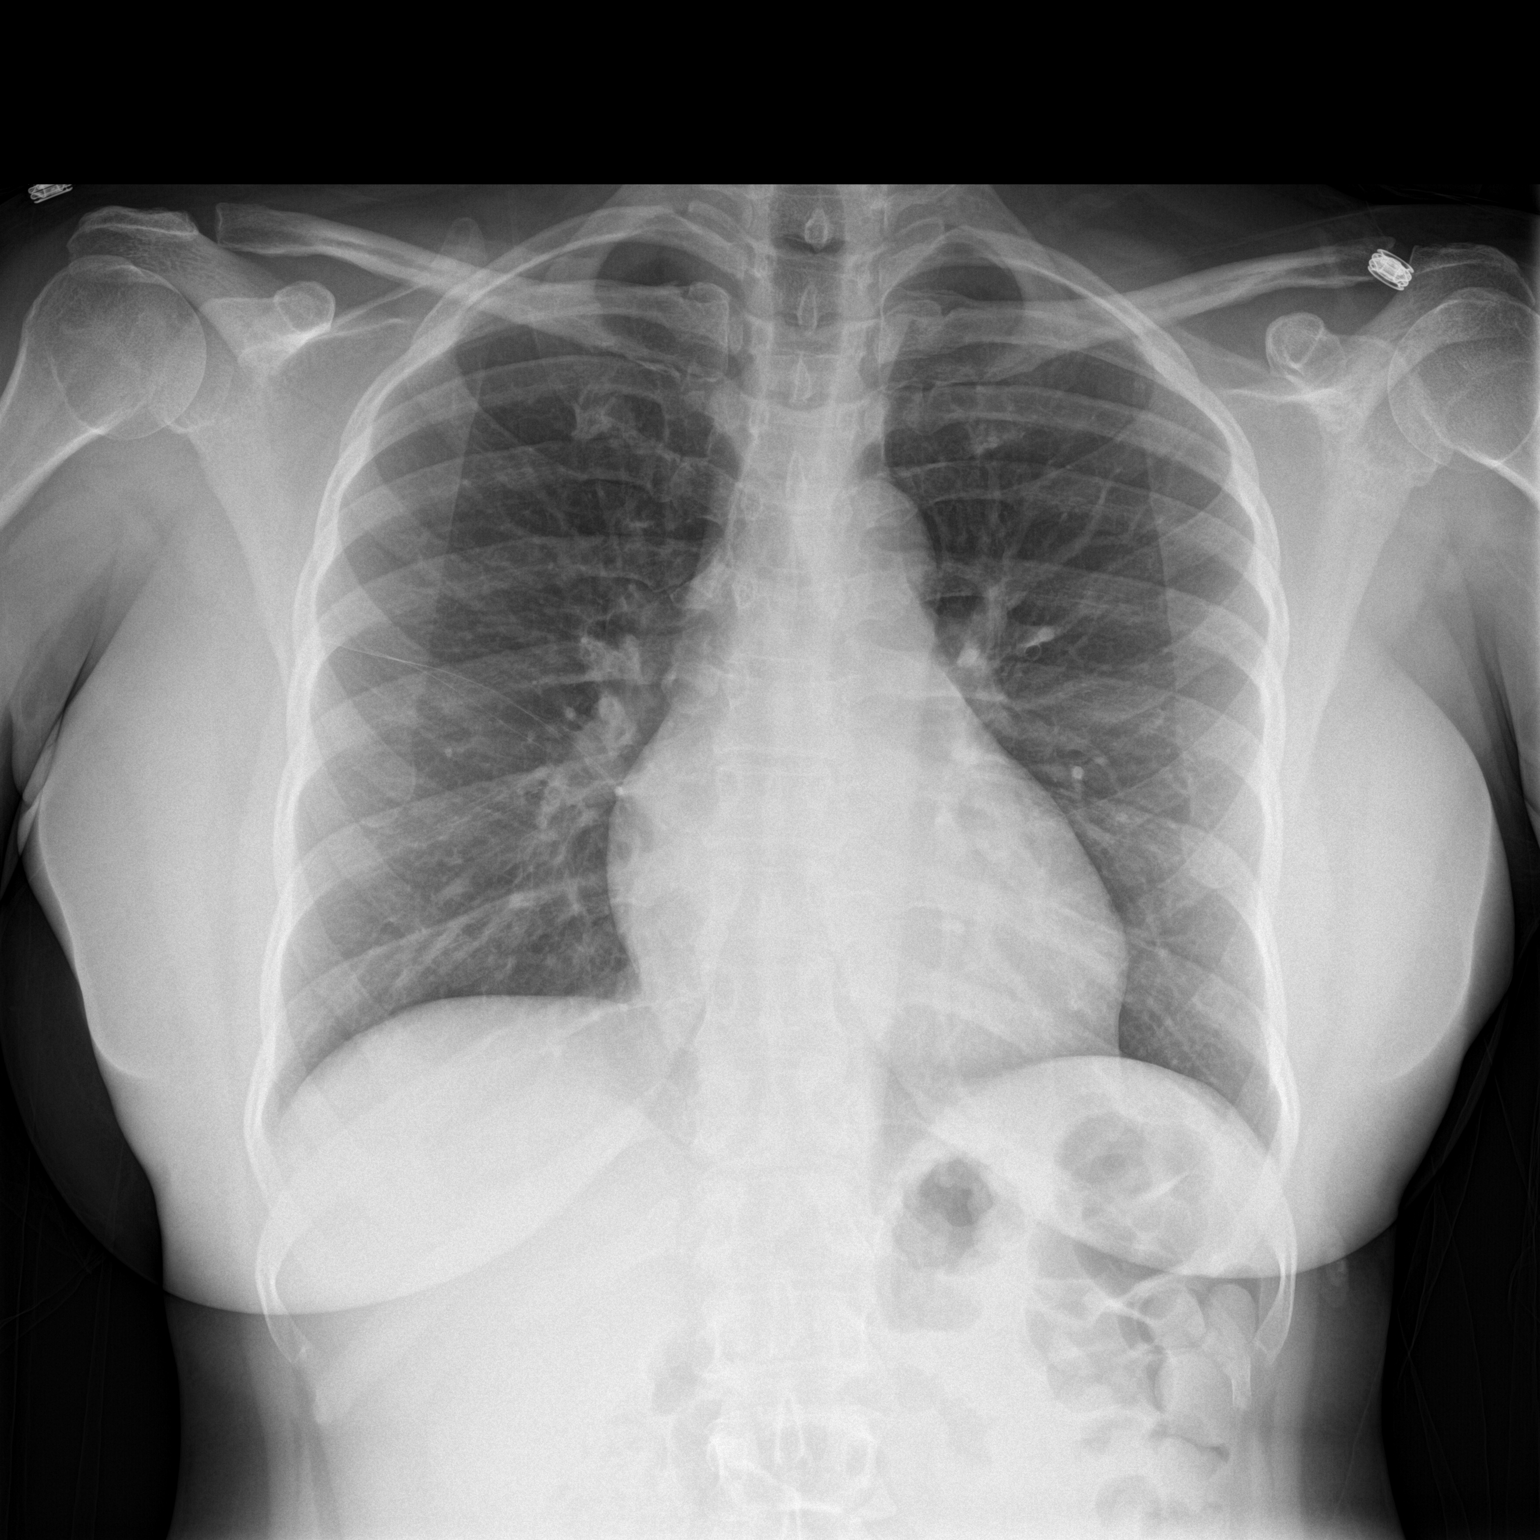

[chest lat]
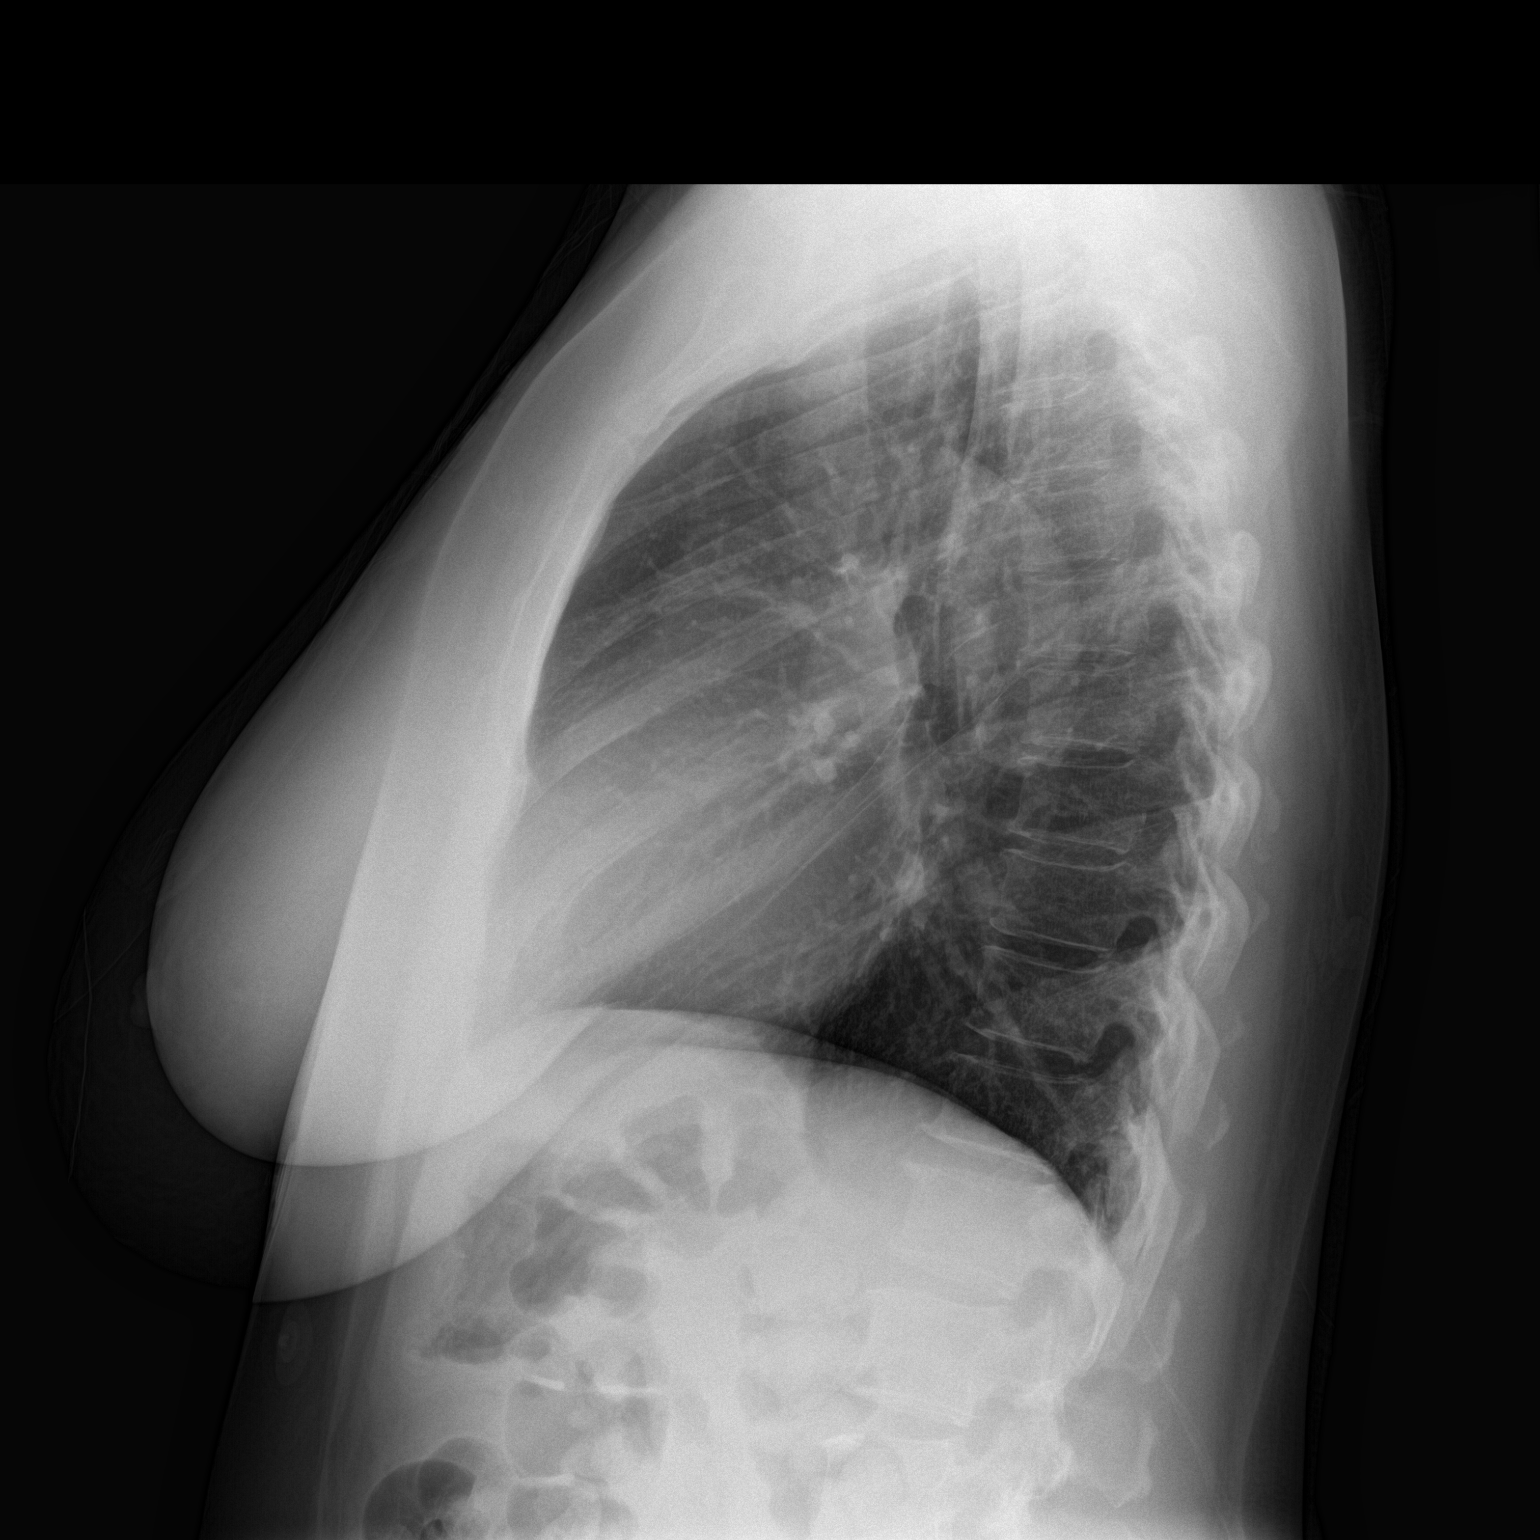

[2 of 2 positions shown; findings below may reference images not displayed]

FINDINGS: The cardiomediastinal contours are normal. The lungs are clear.
Pulmonary vasculature is normal. No consolidation, pleural effusion,
or pneumothorax. No acute osseous abnormalities are seen.
IMPRESSION: No acute pulmonary process.

## 2016-02-18 ENCOUNTER — Observation Stay (HOSPITAL_COMMUNITY)
Admission: EM | Admit: 2016-02-18 | Discharge: 2016-02-19 | Disposition: A | Discharge status: Home / Self Care | Payer: PRIVATE HEALTH INSURANCE | Attending: Family Medicine | Admitting: Family Medicine

## 2016-02-18 ENCOUNTER — Encounter (HOSPITAL_COMMUNITY): Payer: Self-pay

## 2016-02-18 ENCOUNTER — Emergency Department (HOSPITAL_COMMUNITY): Payer: PRIVATE HEALTH INSURANCE

## 2016-02-18 DIAGNOSIS — IMO0001 Reserved for inherently not codable concepts without codable children: Secondary | ICD-10-CM | POA: Diagnosis present

## 2016-02-18 DIAGNOSIS — E663 Overweight: Secondary | ICD-10-CM | POA: Insufficient documentation

## 2016-02-18 DIAGNOSIS — E039 Hypothyroidism, unspecified: Secondary | ICD-10-CM | POA: Diagnosis not present

## 2016-02-18 DIAGNOSIS — R030 Elevated blood-pressure reading, without diagnosis of hypertension: Secondary | ICD-10-CM | POA: Diagnosis not present

## 2016-02-18 DIAGNOSIS — Z79899 Other long term (current) drug therapy: Secondary | ICD-10-CM | POA: Diagnosis not present

## 2016-02-18 DIAGNOSIS — E038 Other specified hypothyroidism: Secondary | ICD-10-CM

## 2016-02-18 DIAGNOSIS — Z6830 Body mass index (BMI) 30.0-30.9, adult: Secondary | ICD-10-CM | POA: Diagnosis not present

## 2016-02-18 DIAGNOSIS — A419 Sepsis, unspecified organism: Secondary | ICD-10-CM | POA: Diagnosis not present

## 2016-02-18 DIAGNOSIS — I10 Essential (primary) hypertension: Secondary | ICD-10-CM | POA: Diagnosis not present

## 2016-02-18 DIAGNOSIS — R509 Fever, unspecified: Secondary | ICD-10-CM | POA: Diagnosis not present

## 2016-02-18 HISTORY — DX: Essential (primary) hypertension: I10

## 2016-02-18 HISTORY — DX: Anemia, unspecified: D64.9

## 2016-02-18 LAB — CBC WITH DIFFERENTIAL/PLATELET
BASOS ABS: 0 10*3/uL (ref 0.0–0.1)
BASOS PCT: 0 %
EOS ABS: 0 10*3/uL (ref 0.0–0.7)
Eosinophils Relative: 0 %
HCT: 40.9 % (ref 36.0–46.0)
Hemoglobin: 13.3 g/dL (ref 12.0–15.0)
Lymphocytes Relative: 9 %
Lymphs Abs: 1 10*3/uL (ref 0.7–4.0)
MCH: 33.6 pg (ref 26.0–34.0)
MCHC: 32.5 g/dL (ref 30.0–36.0)
MCV: 103.3 fL — ABNORMAL HIGH (ref 78.0–100.0)
MONOS PCT: 3 %
Monocytes Absolute: 0.3 10*3/uL (ref 0.1–1.0)
NEUTROS PCT: 88 %
Neutro Abs: 9.9 10*3/uL — ABNORMAL HIGH (ref 1.7–7.7)
Platelets: 185 10*3/uL (ref 150–400)
RBC: 3.96 MIL/uL (ref 3.87–5.11)
RDW: 12.9 % (ref 11.5–15.5)
WBC: 11.2 10*3/uL — ABNORMAL HIGH (ref 4.0–10.5)

## 2016-02-18 LAB — URINALYSIS, ROUTINE W REFLEX MICROSCOPIC
BILIRUBIN URINE: NEGATIVE
Glucose, UA: NEGATIVE mg/dL
Hgb urine dipstick: NEGATIVE
Ketones, ur: NEGATIVE mg/dL
Leukocytes, UA: NEGATIVE
NITRITE: NEGATIVE
Protein, ur: NEGATIVE mg/dL
Specific Gravity, Urine: 1.015 (ref 1.005–1.030)
pH: 7 (ref 5.0–8.0)

## 2016-02-18 LAB — COMPREHENSIVE METABOLIC PANEL
ALK PHOS: 52 U/L (ref 38–126)
ALT: 24 U/L (ref 14–54)
ANION GAP: 11 (ref 5–15)
AST: 32 U/L (ref 15–41)
Albumin: 3.9 g/dL (ref 3.5–5.0)
BUN: 12 mg/dL (ref 6–20)
CHLORIDE: 104 mmol/L (ref 101–111)
CO2: 25 mmol/L (ref 22–32)
Calcium: 9.6 mg/dL (ref 8.9–10.3)
Creatinine, Ser: 0.83 mg/dL (ref 0.44–1.00)
GFR calc Af Amer: >60 mL/min (ref >60)
GFR calc non Af Amer: >60 mL/min (ref >60)
Glucose, Bld: 101 mg/dL — ABNORMAL HIGH (ref 65–99)
Potassium: 4.4 mmol/L (ref 3.5–5.1)
SODIUM: 140 mmol/L (ref 135–145)
Total Bilirubin: 0.7 mg/dL (ref 0.3–1.2)
Total Protein: 7 g/dL (ref 6.5–8.1)

## 2016-02-18 LAB — I-STAT BETA HCG BLOOD, ED (MC, WL, AP ONLY): I-stat hCG, quantitative: 9 m[IU]/mL — ABNORMAL HIGH (ref <5)

## 2016-02-18 LAB — INFLUENZA PANEL BY PCR (TYPE A & B)
H1N1 flu by pcr: NOT DETECTED
Influenza A By PCR: NEGATIVE
Influenza B By PCR: NEGATIVE

## 2016-02-18 LAB — I-STAT CG4 LACTIC ACID, ED
LACTIC ACID, VENOUS: 2.58 mmol/L — AB (ref 0.5–1.9)
Lactic Acid, Venous: 1.64 mmol/L (ref 0.5–1.9)

## 2016-02-18 LAB — HCG, QUANTITATIVE, PREGNANCY: HCG, BETA CHAIN, QUANT, S: 5 m[IU]/mL — AB (ref <5)

## 2016-02-18 LAB — TSH: TSH: 1.629 u[IU]/mL (ref 0.350–4.500)

## 2016-02-18 LAB — PREGNANCY, URINE: Preg Test, Ur: NEGATIVE

## 2016-02-18 MED ORDER — ACETAMINOPHEN 325 MG PO TABS
650.0000 mg | ORAL_TABLET | Freq: Once | ORAL | Status: AC
  Administered 2016-02-18: 650 mg via ORAL

## 2016-02-18 MED ORDER — IBUPROFEN 400 MG PO TABS
600.0000 mg | ORAL_TABLET | Freq: Once | ORAL | Status: AC
  Administered 2016-02-18: 600 mg via ORAL
  Filled 2016-02-18: qty 1

## 2016-02-18 MED ORDER — SODIUM CHLORIDE 0.9 % IV BOLUS (SEPSIS)
1000.0000 mL | Freq: Once | INTRAVENOUS | Status: AC
  Administered 2016-02-18: 1000 mL via INTRAVENOUS

## 2016-02-18 MED ORDER — ACETAMINOPHEN 325 MG PO TABS
ORAL_TABLET | ORAL | Status: AC
  Filled 2016-02-18: qty 2

## 2016-02-18 MED ORDER — VANCOMYCIN HCL IN DEXTROSE 1-5 GM/200ML-% IV SOLN
1000.0000 mg | Freq: Once | INTRAVENOUS | Status: AC
  Administered 2016-02-18: 1000 mg via INTRAVENOUS
  Filled 2016-02-18: qty 200

## 2016-02-18 MED ORDER — FERROUS SULFATE 325 (65 FE) MG PO TABS: 975.0000 mg | ORAL_TABLET | Freq: Every day | ORAL | Status: DC

## 2016-02-18 MED ORDER — PIPERACILLIN-TAZOBACTAM 3.375 G IVPB 30 MIN
3.3750 g | Freq: Once | INTRAVENOUS | Status: AC
  Administered 2016-02-18: 3.375 g via INTRAVENOUS
  Filled 2016-02-18: qty 50

## 2016-02-18 MED ORDER — DOCUSATE SODIUM 100 MG PO CAPS
100.0000 mg | ORAL_CAPSULE | Freq: Two times a day (BID) | ORAL | Status: DC
  Administered 2016-02-18 – 2016-02-19 (×2): 100 mg via ORAL
  Filled 2016-02-18 (×3): qty 1

## 2016-02-18 MED ORDER — IBUPROFEN 400 MG PO TABS: 400.0000 mg | ORAL_TABLET | ORAL | Status: DC | PRN

## 2016-02-18 MED ORDER — LEVOTHYROXINE SODIUM 100 MCG PO TABS
125.0000 ug | ORAL_TABLET | Freq: Every day | ORAL | Status: DC
  Administered 2016-02-19: 125 ug via ORAL
  Filled 2016-02-18: qty 1

## 2016-02-18 MED ORDER — LISINOPRIL 10 MG PO TABS: 10.0000 mg | ORAL_TABLET | Freq: Every day | ORAL | Status: DC

## 2016-02-18 MED ORDER — ACETAMINOPHEN 650 MG RE SUPP: 650.0000 mg | Freq: Four times a day (QID) | RECTAL | Status: DC | PRN

## 2016-02-18 MED ORDER — PIPERACILLIN-TAZOBACTAM 3.375 G IVPB: 3.3750 g | Freq: Three times a day (TID) | INTRAVENOUS | Status: DC

## 2016-02-18 MED ORDER — PIPERACILLIN-TAZOBACTAM 3.375 G IVPB
3.3750 g | Freq: Three times a day (TID) | INTRAVENOUS | Status: DC
  Administered 2016-02-18 – 2016-02-19 (×3): 3.375 g via INTRAVENOUS
  Filled 2016-02-18 (×6): qty 50

## 2016-02-18 MED ORDER — SODIUM CHLORIDE 0.9 % IV BOLUS (SEPSIS)
500.0000 mL | Freq: Once | INTRAVENOUS | Status: AC
  Administered 2016-02-18: 500 mL via INTRAVENOUS

## 2016-02-18 MED ORDER — SODIUM CHLORIDE 0.9 % IV SOLN
INTRAVENOUS | Status: DC
  Administered 2016-02-18: 17:00:00 via INTRAVENOUS

## 2016-02-18 MED ORDER — VANCOMYCIN HCL IN DEXTROSE 1-5 GM/200ML-% IV SOLN
1000.0000 mg | Freq: Three times a day (TID) | INTRAVENOUS | Status: DC
  Administered 2016-02-18 – 2016-02-19 (×3): 1000 mg via INTRAVENOUS
  Filled 2016-02-18 (×5): qty 200

## 2016-02-18 MED ORDER — ACETAMINOPHEN 325 MG PO TABS
650.0000 mg | ORAL_TABLET | Freq: Four times a day (QID) | ORAL | Status: DC | PRN
  Administered 2016-02-18: 650 mg via ORAL
  Filled 2016-02-18: qty 2

## 2016-02-18 MED ORDER — POLYETHYLENE GLYCOL 3350 17 G PO PACK: 17.0000 g | PACK | Freq: Every day | ORAL | Status: DC | PRN

## 2016-02-18 MED ORDER — ENOXAPARIN SODIUM 40 MG/0.4ML ~~LOC~~ SOLN
40.0000 mg | SUBCUTANEOUS | Status: DC
  Administered 2016-02-18: 40 mg via SUBCUTANEOUS
  Filled 2016-02-18: qty 0.4

## 2016-02-18 NOTE — ED Triage Notes (Signed)
Patient complains of chills since awakening this am with headache, denies urinary symptoms, no cough.  States she feels cold, alert and oriented

## 2016-02-18 NOTE — H&P (Signed)
[facility] Admission History and Physical Service Pager: [phone]  Patient name: Carol HINDE Medical record number: [id number] Date of birth: 1965-07-24 Age: 50 y.o. Gender: female  Primary Care Provider: Eli Hose, MD [facility]   Consultants: none Code Status: FULL   Chief Complaint: fever  Assessment and Plan: Carol [name] is a 50 y.o. female presenting with fever and sepsis of an unknown source. PMH is significant for HTN and hypothyroidism.   Febrile illness: Pt awoke with chills and sweats this morning. Febrile to 103 in ED, afebrile at admission. BP 95/73 and tachycardia to max 119. SIRS 3/4 at presentation. Qsofa 0 at admission. Met sepsis protocol. Lactate 2.58 > 1.64 after 30cc/kg bolus. WBC only mildly elevated at 11.2 Pt notes clear rhinorrhea, but no other symptoms. No sick contacts. Likely viral illness, additional considerations include sinusitis, PID. CXR clear. UA without results concerning for infection. Low suspicion for meningitis given lack of lack of AMS, nuchal rigidity, and severe headache.  -observe under Dr. McDiarmid as attending -continue vanc/zosyn -f/u blood cultures -RVP -flu test -continue IVF at 125cc/hr -tylenol prn for fevers   Hypothyroidism: Last TSH 1.234 in 06/2014. Denies recent changes to her home dose of 14mg.  -recheck TSH -continue home synthroid  Hypertension: Pt takes lisinopril at home, was not able to take them this morning.  -hold antihypertensives while normotensive  FEN/GI: heart healthy diet, no GI prophylaxis, NS at 125 cc/hr  Prophylaxis: lovenox  Disposition: observation, Dr. McDiarmid attending  History of Present Illness:  Carol CADENHEADis a 50y.o. female presenting with fever and chills, starting this morning. Woke up this morning drenched in sweat so thought it was related to menopause and changed. Laid back down and then was shivering  with lots of chills. She asked her son to bring her to the hospital and was found to have a fever to 103. Had previously been feeling fine. Says she's had some allergies. Is bothered by her sinuses. Has been having clear snot that is not abnormal for her. Denies cough. No sick contacts. GYolanda Boninehas had cough. No influenza vaccine. Endorses mild RLQ pain, which is off and on for her, and she does suffer from constipation. Has mild neck pain along trapezius, which is also not new.   Of note, last sexual intercourse this weekend. Used a condom. Reports menstrual periods are very irregular and have been for almost a year. LMP was in July.   No recent changes to Synthroid dose.   In ED, pt was febrile to 103, few BPs 90s/60s, and tachycardic to 110s. Sepsis protocol was called, vanc/zosyn started. Lactate 2.58 > 1.64 after 30cc/kg bolus. Urinalysis clean. CXR without active cardiopulmonary disease. I-stat bHCG 9, repeat 5.   Review Of Systems: Per HPI with the following additions: no dysuria.  Otherwise the remainder of the systems were negative.  Patient Active Problem List   Diagnosis Date Noted  . Febrile illness 02/18/2016  . Hypothyroidism 06/23/2014  . Chest pain 06/23/2014  . Elevated blood pressure 06/23/2014  . Blurry vision 06/23/2014    Past Medical History: Past Medical History:  Diagnosis Date  . Hypertension   . Hypothyroidism   . Thyroid disease     Past Surgical History: Past Surgical History:  Procedure Laterality Date  . CESAREAN SECTION    . WRIST SURGERY Right 1994    Social History: Social History  Substance Use Topics  . Smoking status: Never Smoker  .  Smokeless tobacco: Never Used  . Alcohol use No   Additional social history: 1 glass of wine daily to every few days, no illicit drug   Please also refer to relevant sections of EMR.  Family History: No family history on file. Son had a kidney transplant.   Allergies and Medications: Allergies   Allergen Reactions  . Shellfish Allergy Hives    Crab    No current facility-administered medications on file prior to encounter.    Current Outpatient Prescriptions on File Prior to Encounter  Medication Sig Dispense Refill  . Cholecalciferol (VITAMIN D3 PO) Take 1 tablet by mouth daily.    Marland Kitchen levothyroxine (SYNTHROID, LEVOTHROID) 125 MCG tablet Take 125 mcg by mouth daily before breakfast.    . lisinopril (PRINIVIL) 10 MG tablet Take 1 tablet (10 mg total) by mouth daily. 30 tablet 0  . Multiple Vitamin (MULTIVITAMIN WITH MINERALS) TABS tablet Take 1 tablet by mouth daily.    Marland Kitchen atorvastatin (LIPITOR) 10 MG tablet Take 1 tablet (10 mg total) by mouth daily. (Patient not taking: Reported on 02/18/2016) 30 tablet 3  . omeprazole (PRILOSEC) 40 MG capsule Take 1 capsule (40 mg total) by mouth daily. (Patient not taking: Reported on 02/18/2016) 30 capsule 1  . [DISCONTINUED] fexofenadine (ALLEGRA) 180 MG tablet Take 1 tablet (180 mg total) by mouth daily. (Patient not taking: Reported on 06/23/2014) 30 tablet 1  . [DISCONTINUED] fluticasone (FLONASE) 50 MCG/ACT nasal spray Place 1 spray into the nose 2 (two) times daily. (Patient not taking: Reported on 06/23/2014) 1 g 2    Objective: BP 121/71   Pulse 107   Temp 102.8 F (39.3 C) (Oral)   Resp (!) 31   Ht _0  (1.651 m)   Wt 182 lb (82.6 kg)   SpO2 98%   BMI 30.29 kg/m  Exam: General: Overweight female in NAD resting comfortably in bed.  Eyes: EOMI, PERRL  ENTM: moist mucous membranes Neck: no tenderness to palpation over spinous processes, mild tenderness over R paraspinal muscles and R trapezius. No meningismus.  Cardiovascular: sinus tachycardia, no m/r/g, 2+ pedal pulses, no LE edema  Respiratory: CTAB, good air movement, normal WOB  Abdomen: SNTND Skin: no rashes or wounds Neuro: CN II-XII grossly intact, no focal deficits  Psych: mood and affect appropriate  Labs and Imaging: CBC BMET   Recent Labs Lab 02/18/16 0847   WBC 11.2*  HGB 13.3  HCT 40.9  PLT 185    Recent Labs Lab 02/18/16 0847  NA 140  K 4.4  CL 104  CO2 25  BUN 12  CREATININE 0.83  GLUCOSE 101*  CALCIUM 9.6     I stat Beta HCG 9, repeat serum Hcg 5, ordered a repeat urine pregnancy test.   Sela Hilding, MD 02/18/2016, 2:11 PM PGY-1, [place] Intern pager: [phone], text pages welcome   Upper Level Addendum:  I have seen and evaluated this patient along with Dr. Lindell Noe and reviewed the above note, making necessary revisions in green.   Phill Myron, D.O. 02/18/2016, 3:23 PM PGY-2, [place]

## 2016-02-18 NOTE — ED Provider Notes (Addendum)
[facility] DEPT Provider Note   CSN: [id number] Arrival date & time: 02/18/16  0831     History   Chief Complaint Chief Complaint  Patient presents with  . Fever  . Chills    HPI Carol [name] is a 50 y.o. female.  Patient is a 50 year old female who presents with fever and chills. She states she was feeling fine yesterday. She woke up this morning with chills and myalgias. She's noted to have a fever of 103. She's had a 2 day history of some mild nasal congestion and a little bit of cough. She denies any rashes. No open sores or wounds. No recent tick bites or other insect bites. No sore throat. No urinary symptoms. No abdominal pain. She had a headache earlier but currently denies any headache. She denies any neck pain or stiffness. She has had some Tylenol and feels a little bit better after this.    Fever   Associated symptoms include headaches. Pertinent negatives include no chest pain, no diarrhea, no vomiting, no congestion and no cough.    Past Medical History:  Diagnosis Date  . Hypertension   . Hypothyroidism   . Thyroid disease     Patient Active Problem List   Diagnosis Date Noted  . Febrile illness 02/18/2016  . Hypothyroidism 06/23/2014  . Chest pain 06/23/2014  . Elevated blood pressure 06/23/2014  . Blurry vision 06/23/2014    Past Surgical History:  Procedure Laterality Date  . CESAREAN SECTION    . WRIST SURGERY Right 1994    OB History    No data available       Home Medications    Prior to Admission medications   Medication Sig Start Date End Date Taking? Authorizing Provider  Cholecalciferol (VITAMIN D3 PO) Take 1 tablet by mouth daily.   Yes Historical Provider, MD  ferrous sulfate 325 (65 FE) MG EC tablet Take 975 mg by mouth daily with breakfast.   Yes Historical Provider, MD  levothyroxine (SYNTHROID, LEVOTHROID) 125 MCG tablet Take 125 mcg by mouth daily before breakfast.   Yes Historical Provider, MD  lisinopril  (PRINIVIL) 10 MG tablet Take 1 tablet (10 mg total) by mouth daily. 06/23/14  Yes Bobby Rumpf [place], PA-C  Multiple Vitamin (MULTIVITAMIN WITH MINERALS) TABS tablet Take 1 tablet by mouth daily.   Yes Historical Provider, MD  psyllium (REGULOID) 0.52 g capsule Take 0.52 g by mouth daily.   Yes Historical Provider, MD  atorvastatin (LIPITOR) 10 MG tablet Take 1 tablet (10 mg total) by mouth daily. Patient not taking: Reported on 02/18/2016 06/23/14   Melton Alar, PA-C  omeprazole (PRILOSEC) 40 MG capsule Take 1 capsule (40 mg total) by mouth daily. Patient not taking: Reported on 02/18/2016 10/20/14   Gregor Hams, MD    Family History No family history on file.  Social History Social History  Substance Use Topics  . Smoking status: Never Smoker  . Smokeless tobacco: Never Used  . Alcohol use No     Allergies   Shellfish allergy   Review of Systems Review of Systems  Constitutional: Positive for chills, fatigue and fever. Negative for diaphoresis.  HENT: Negative for congestion, rhinorrhea and sneezing.   Eyes: Negative.   Respiratory: Negative for cough, chest tightness and shortness of breath.   Cardiovascular: Negative for chest pain and leg swelling.  Gastrointestinal: Negative for abdominal pain, blood in stool, diarrhea, nausea and vomiting.  Genitourinary: Negative for difficulty urinating, flank pain, frequency and hematuria.  Musculoskeletal: Positive for myalgias. Negative for arthralgias and back pain.  Skin: Negative for rash.  Neurological: Positive for headaches. Negative for dizziness, speech difficulty, weakness and numbness.     Physical Exam Updated Vital Signs BP 121/71   Pulse 107   Temp 102.8 F (39.3 C) (Oral)   Resp (!) 31   Ht 5' 5" (1.651 m)   Wt 182 lb (82.6 kg)   SpO2 98%   BMI 30.29 kg/m   Physical Exam  Constitutional: She is oriented to person, place, and time. She appears well-developed and well-nourished.  HENT:  Head: Normocephalic  and atraumatic.  Right Ear: External ear normal.  Left Ear: External ear normal.  Mouth/Throat: Oropharynx is clear and moist.  Eyes: Pupils are equal, round, and reactive to light.  No photophobia  Neck: Normal range of motion. Neck supple.  No meningismus  Cardiovascular: Normal rate, regular rhythm and normal heart sounds.   Pulmonary/Chest: Effort normal and breath sounds normal. No respiratory distress. She has no wheezes. She has no rales. She exhibits no tenderness.  Abdominal: Soft. Bowel sounds are normal. There is no tenderness. There is no rebound and no guarding.  Musculoskeletal: Normal range of motion. She exhibits no edema.  Lymphadenopathy:    She has no cervical adenopathy.  Neurological: She is alert and oriented to person, place, and time.  Skin: Skin is warm and dry. No rash noted.  Psychiatric: She has a normal mood and affect.     ED Treatments / Results  Labs (all labs ordered are listed, but only abnormal results are displayed) Labs Reviewed  COMPREHENSIVE METABOLIC PANEL - Abnormal; Notable for the following:       Result Value   Glucose, Bld 101 (*)    All other components within normal limits  CBC WITH DIFFERENTIAL/PLATELET - Abnormal; Notable for the following:    WBC 11.2 (*)    MCV 103.3 (*)    Neutro Abs 9.9 (*)    All other components within normal limits  HCG, QUANTITATIVE, PREGNANCY - Abnormal; Notable for the following:    hCG, Beta Chain, Quant, S 5 (*)    All other components within normal limits  I-STAT BETA HCG BLOOD, ED ([facility], [facility], [facility] ONLY) - Abnormal; Notable for the following:    I-stat hCG, quantitative 9.0 (*)    All other components within normal limits  I-STAT CG4 LACTIC ACID, ED - Abnormal; Notable for the following:    Lactic Acid, Venous 2.58 (*)    All other components within normal limits  CULTURE, BLOOD (ROUTINE X 2)  CULTURE, BLOOD (ROUTINE X 2)  URINE CULTURE  URINALYSIS, ROUTINE W REFLEX MICROSCOPIC (NOT AT [facility])    I-STAT CG4 LACTIC ACID, ED  I-STAT CG4 LACTIC ACID, ED  I-STAT CG4 LACTIC ACID, ED    EKG  EKG Interpretation  Date/Time:  Monday February 18 2016 11:40:47 EDT Ventricular Rate:  107 PR Interval:    QRS Duration: 72 QT Interval:  308 QTC Calculation: 411 R Axis:   39 Text Interpretation:  Sinus tachycardia Borderline T abnormalities, diffuse leads since last tracing no significant change Confirmed by BELFI  MD, MELANIE ([id number]) on 02/18/2016 2:07:50 PM       Radiology Dg Chest 2 View  Result Date: 02/18/2016 CLINICAL DATA:  Fever EXAM: CHEST  2 VIEW COMPARISON:  06/23/2014 FINDINGS: The heart size and mediastinal contours are within normal limits. Both lungs are clear. The visualized skeletal structures are unremarkable. IMPRESSION: No active cardiopulmonary  disease. Electronically Signed   By: Kerby Moors M.D.   On: 02/18/2016 09:08    Procedures Procedures (including critical care time)  Medications Ordered in ED Medications  vancomycin (VANCOCIN) IVPB 1000 mg/200 mL premix (not administered)  piperacillin-tazobactam (ZOSYN) IVPB 3.375 g (not administered)  acetaminophen (TYLENOL) tablet 650 mg (650 mg Oral Given 02/18/16 0846)  sodium chloride 0.9 % bolus 1,000 mL (0 mLs Intravenous Stopped 02/18/16 1135)    And  sodium chloride 0.9 % bolus 1,000 mL (0 mLs Intravenous Stopped 02/18/16 1359)    And  sodium chloride 0.9 % bolus 500 mL (0 mLs Intravenous Stopped 02/18/16 1359)  piperacillin-tazobactam (ZOSYN) IVPB 3.375 g (0 g Intravenous Stopped 02/18/16 1112)  vancomycin (VANCOCIN) IVPB 1000 mg/200 mL premix (0 mg Intravenous Stopped 02/18/16 1248)  ibuprofen (ADVIL,MOTRIN) tablet 600 mg (600 mg Oral Given 02/18/16 1235)     Initial Impression / Assessment and Plan / ED Course  I have reviewed the triage vital signs and the nursing notes.  Pertinent labs & imaging results that were available during my care of the patient were reviewed by me and considered in my medical  decision making (see chart for details).  Clinical Course    Patient presents with fever and chills. She met criteria for sepsis activation on arrival. She was treated with IV antibiotics and fluid bolus per protocol.  Currently she only complains of a mild headache. I don't see any obvious source for the infection. There is no evidence of pneumonia. No evidence of a urinary tract infection. No clinical suspicion of meningitis. She's had some minor URI symptoms. Her lactate was normal on arrival but has normalized after IV fluids. Her fever has resolved to 99 however she still remains tachycardic with a heart rate in the 110s. Given that she still has abnormal vital signs after 2.5 L of fluid within initially elevated lactate, I feel that we should admit patient for observation.  Pt is unassigned, I spoke with the [place] resident who will admit the pt for obs, med/surg  Final Clinical Impressions(s) / ED Diagnoses   Final diagnoses:  Febrile illness    New Prescriptions New Prescriptions   No medications on file     Malvin Johns, MD 02/18/16 [place], MD 02/18/16 1408

## 2016-02-18 NOTE — ED Notes (Signed)
Pt. Reports itching and redness to neck. RN slowed rate of vanc to 75 mL/hr

## 2016-02-18 NOTE — Progress Notes (Signed)
Pharmacy Antibiotic Note  Carol [name] is a 50 y.o. female admitted on 02/18/2016 with sepsis.  Pharmacy has been consulted for vancomycin and zosyn dosing.  Pt received vancomycin 1g and zosyn 3.375g IV once in the ED.  Plan: Vancomycin 1g IV every 8 hours.  Goal trough 15-20 mcg/mL. Zosyn 3.375g IV q8h (4 hour infusion).  Monitor culture data, renal function and clinical course VT at SS prn  Height: 5\' 5"  (165.1 cm) Weight: 182 lb (82.6 kg) IBW/kg (Calculated) : 57  Temp (24hrs), Avg:103 F (39.4 C), Min:102.8 F (39.3 C), Max:103.1 F (39.5 C)   Recent Labs Lab 02/18/16 0847 02/18/16 0927  WBC 11.2*  --   LATICACIDVEN  --  2.58*    CrCl cannot be calculated (Patient's most recent lab result is older than the maximum 21 days allowed.).    Allergies  Allergen Reactions  . Shellfish Allergy Hives    Crab     Antimicrobials this admission: Vanc 9/11 >>  Zosyn 9/11 >>   Dose adjustments this admission: n/a  Microbiology results:  BCx:   UCx:    Sputum:    MRSA PCR:    Andrey Cota. Diona Foley, PharmD, BCPS Clinical Pharmacist Pager [phone] 02/18/2016 10:04 AM

## 2016-02-18 NOTE — ED Notes (Signed)
Got patient into a gown and on the monitor 

## 2016-02-18 NOTE — ED Notes (Signed)
Walked patient to the bathroom b

## 2016-02-18 NOTE — ED Notes (Signed)
MD at bedside. 

## 2016-02-18 NOTE — ED Notes (Signed)
Walked patient to the bathroom  

## 2016-02-18 NOTE — Plan of Care (Signed)
Problem: Education: Goal: Knowledge of [facility] Education information/materials will improve Outcome: Progressing Introduce to room and equipment

## 2016-02-18 NOTE — ED Notes (Signed)
Pt unable to void at this time. 

## 2016-02-19 LAB — BASIC METABOLIC PANEL
ANION GAP: 6 (ref 5–15)
BUN: 5 mg/dL — ABNORMAL LOW (ref 6–20)
CO2: 24 mmol/L (ref 22–32)
Calcium: 8.6 mg/dL — ABNORMAL LOW (ref 8.9–10.3)
Chloride: 110 mmol/L (ref 101–111)
Creatinine, Ser: 0.78 mg/dL (ref 0.44–1.00)
GFR calc non Af Amer: >60 mL/min (ref >60)
Glucose, Bld: 135 mg/dL — ABNORMAL HIGH (ref 65–99)
POTASSIUM: 3.2 mmol/L — AB (ref 3.5–5.1)
Sodium: 140 mmol/L (ref 135–145)

## 2016-02-19 LAB — RESPIRATORY PANEL BY PCR
ADENOVIRUS-RVPPCR: NOT DETECTED
Bordetella pertussis: NOT DETECTED
CHLAMYDOPHILA PNEUMONIAE-RVPPCR: NOT DETECTED
CORONAVIRUS 229E-RVPPCR: NOT DETECTED
CORONAVIRUS HKU1-RVPPCR: NOT DETECTED
CORONAVIRUS NL63-RVPPCR: NOT DETECTED
Coronavirus OC43: NOT DETECTED
Influenza A: NOT DETECTED
Influenza B: NOT DETECTED
MYCOPLASMA PNEUMONIAE-RVPPCR: NOT DETECTED
Metapneumovirus: NOT DETECTED
PARAINFLUENZA VIRUS 3-RVPPCR: NOT DETECTED
Parainfluenza Virus 1: NOT DETECTED
Parainfluenza Virus 2: NOT DETECTED
Parainfluenza Virus 4: NOT DETECTED
Respiratory Syncytial Virus: NOT DETECTED
Rhinovirus / Enterovirus: NOT DETECTED

## 2016-02-19 LAB — CBC
HCT: 33.9 % — ABNORMAL LOW (ref 36.0–46.0)
HEMOGLOBIN: 11 g/dL — AB (ref 12.0–15.0)
MCH: 33.3 pg (ref 26.0–34.0)
MCHC: 32.4 g/dL (ref 30.0–36.0)
MCV: 102.7 fL — AB (ref 78.0–100.0)
Platelets: 136 10*3/uL — ABNORMAL LOW (ref 150–400)
RBC: 3.3 MIL/uL — AB (ref 3.87–5.11)
RDW: 13.3 % (ref 11.5–15.5)
WBC: 10.7 10*3/uL — AB (ref 4.0–10.5)

## 2016-02-19 MED ORDER — DOCUSATE SODIUM 100 MG PO CAPS: 100.0000 mg | ORAL_CAPSULE | Freq: Two times a day (BID) | ORAL | 0 refills | Status: DC

## 2016-02-19 MED ORDER — POTASSIUM CHLORIDE CRYS ER 20 MEQ PO TBCR
40.0000 meq | EXTENDED_RELEASE_TABLET | Freq: Two times a day (BID) | ORAL | Status: DC
Start: 2016-02-19 — End: 2016-02-19
  Administered 2016-02-19: 40 meq via ORAL
  Filled 2016-02-19: qty 2

## 2016-02-19 NOTE — Discharge Summary (Signed)
[facility] Discharge Summary  Patient name: Carol [name] Medical record number: [id number] Date of birth: Oct 14, 1965 Age: 50 y.o. Gender: female Date of Admission: 02/18/2016  Date of Discharge: 02/19/16  Admitting Physician: Blane Ohara McDiarmid, MD  Primary Care Provider: Velna Ochs, MD Consultants: none  Indication for Hospitalization: febrile illness meeting sepsis protocol   Discharge Diagnoses/Problem List:  Febrile illness HTN, although normotensive here Hypothyroidism  Disposition: home  Discharge Condition: stable  Discharge Exam:  General: Overweight female in NAD resting comfortably in bed.  Neck: no tenderness to palpation over spinous processes, mild tenderness over R paraspinal muscles and R trapezius. No meningismus.  Cardiovascular: sinus tachycardia, no m/r/g, 2+ pedal pulses, no LE edema  Respiratory: CTAB, good air movement, normal WOB  Abdomen: +mildly TTP over lower quadrants;  2 masses palpated over uterus, pt has hx of fibroids, +BS, nondistended Skin: no rashes or wounds  Brief Hospital Course:  Pt presented to ED with fever to 103. She reports awakening that morning with chills and sweats. No sick contacts, no symptoms other than clear rhinorrhea and occasional sinus pain. Of note, she does have a son who has a kidney transplant. In ED, she was tachycardic, had 2 BPs with SBP <90, and lactate mildly elevated, but normalized with 2.5 L bolus. Tachycardia continued, ED started vanc and zosyn through sepsis protocol of unknown source. UA clean. CXR without concern for acute process. Admitted for observation. Blood cultures drawn. Flu test and RVP negative (tested as she has an immunocompromised relative who would need prophylaxis if flu positive). Beta Hcg mildly elevated at 9 and 5. Urine preg negative. Repeat TSH WNL. Discharged with close followup to monitor continued symptoms, thinking this is likely a viral process.    Issues for Follow Up:  1. Follow up pending blood cultures - no growth x 1 day.  2. Consider outpatient workup of mildly elevated beta Hcg for additional gyn processes vs false elevation of test.   Significant Procedures: none  Significant Labs and Imaging:   Recent Labs Lab 02/18/16 0847 02/19/16 0934  WBC 11.2* 10.7*  HGB 13.3 11.0*  HCT 40.9 33.9*  PLT 185 136*    Recent Labs Lab 02/18/16 0847 02/19/16 0934  NA 140 140  K 4.4 3.2*  CL 104 110  CO2 25 24  GLUCOSE 101* 135*  BUN 12 5*  CREATININE 0.83 0.78  CALCIUM 9.6 8.6*  ALKPHOS 52  --   AST 32  --   ALT 24  --   ALBUMIN 3.9  --      Results/Tests Pending at Time of Discharge: blood cultures (NG x 1 day)  Discharge Medications:    Medication List    STOP taking these medications   atorvastatin 10 MG tablet Commonly known as:  LIPITOR   ferrous sulfate 325 (65 FE) MG EC tablet   lisinopril 10 MG tablet Commonly known as:  PRINIVIL   omeprazole 40 MG capsule Commonly known as:  PRILOSEC     TAKE these medications   docusate sodium 100 MG capsule Commonly known as:  COLACE Take 1 capsule (100 mg total) by mouth 2 (two) times daily.   levothyroxine 125 MCG tablet Commonly known as:  SYNTHROID, LEVOTHROID Take 125 mcg by mouth daily before breakfast.   multivitamin with minerals Tabs tablet Take 1 tablet by mouth daily.   psyllium 0.52 g capsule Commonly known as:  REGULOID Take 0.52 g by mouth daily.   VITAMIN D3  PO Take 1 tablet by mouth daily.       Discharge Instructions: Please refer to Patient Instructions section of EMR for full details.  Patient was counseled important signs and symptoms that should prompt return to medical care, changes in medications, dietary instructions, activity restrictions, and follow up appointments.   Follow-Up Appointments: Follow-up Information    Velna Ochs, MD. Schedule an appointment as soon as possible for a visit in 2 day(s).    Specialty:  Internal Medicine Why:  for hospital followup Contact information: [id number] [place] [address] [phone]           Sela Hilding, MD 02/19/2016, 1:09 PM PGY-1, [place]

## 2016-02-19 NOTE — Progress Notes (Signed)
Carol [name] to be D/C'd Home per MD order.  Discussed with the patient and all questions fully answered.    Medication List    STOP taking these medications   atorvastatin 10 MG tablet Commonly known as:  LIPITOR   ferrous sulfate 325 (65 FE) MG EC tablet   lisinopril 10 MG tablet Commonly known as:  PRINIVIL   omeprazole 40 MG capsule Commonly known as:  PRILOSEC     TAKE these medications   docusate sodium 100 MG capsule Commonly known as:  COLACE Take 1 capsule (100 mg total) by mouth 2 (two) times daily.   levothyroxine 125 MCG tablet Commonly known as:  SYNTHROID, LEVOTHROID Take 125 mcg by mouth daily before breakfast.   multivitamin with minerals Tabs tablet Take 1 tablet by mouth daily.   psyllium 0.52 g capsule Commonly known as:  REGULOID Take 0.52 g by mouth daily.   VITAMIN D3 PO Take 1 tablet by mouth daily.       VVS, Skin clean, dry and intact without evidence of skin break down, no evidence of skin tears noted. IV catheter discontinued intact. Site without signs and symptoms of complications. Dressing and pressure applied.  An After Visit Summary was printed and given to the patient. Patient escorted via [place], and D/C home via private auto.  Elias Else D 02/19/2016 3:08 PM

## 2016-02-19 NOTE — Discharge Instructions (Signed)
Be sure to follow up with your primary care provider in the next day or two. We think this is likely a virus, and your primary care doctor can check on the status of your respiratory virus panel and any other labs. Return to the emergency room if you feel worse, develop trouble breathing or worsening abdominal pain or headache that doesn't go away.  Talk to your primary care provider about restarting your lisinopril (blood pressure medicine) and iron. Don't take the iron while you have the virus, and you haven't needed the lisinopril while you were here.

## 2016-02-19 NOTE — Progress Notes (Signed)
Family Medicine Teaching Service Daily Progress Note Intern Pager: [phone]  Patient name: Carol [name] Medical record number: [id number] Date of birth: [birth year] Age: 50 y.o. Gender: female  Primary Care Provider: Velna Ochs, MD Consultants: none Code Status: FULL  Pt Overview and Major Events to Date:  9/11: met sepsis criteria in ED, treated with vanc/zosyn no apparent source 9/12: flu negative  Assessment and Plan: Carol [name] is a 50 y.o. female presenting with fever and sepsis of an unknown source. PMH is significant for HTN and hypothyroidism.   Febrile illness: Pt awoke with chills and sweats on day of admission. Febrile to 103 in ED. BP 95/73 and tachycardia to max 119. SIRS 3/4 at presentation. Qsofa 0 at admission. Met sepsis protocol. Lactate 2.58 > 1.64 after 30cc/kg bolus. WBC only mildly elevated at 11.2 Pt notes clear rhinorrhea, but no other symptoms. No sick contacts. Likely viral illness, additional considerations include sinusitis, PID. CXR clear. UA without results concerning for infection. Low suspicion for meningitis given lack of lack of AMS, nuchal rigidity, and severe headache. Flu test negative.  -continue vanc/zosyn, discontinue if blood cultures negative -f/u blood cultures -RVP pending -tylenol prn for fevers   Hypothyroidism: Last TSH 1.234 in 06/2014. Denies recent changes to her home dose of 163mg. Recheck TSH 1.629.  -continue home synthroid  Hypertension: Pt takes lisinopril at home, was not able to take them this morning.  -hold antihypertensives while normotensive  FEN/GI: heart healthy diet, no GI prophylaxis, NS at 125 cc/hr  Prophylaxis: lovenox  Disposition: continued observation  Subjective:  Pt feels well this morning, endorses some mild lower abdominal pain, which she attributes to sleeping in a different bed.  Objective: Temp:  [99.1 F (37.3 C)-103.1 F (39.5 C)] 99.8 F (37.7 C) (09/12 0550) Pulse  Rate:  [96-119] 97 (09/12 0550) Resp:  [15-31] 18 (09/12 0550) BP: (95-126)/(58-92) 106/70 (09/12 0550) SpO2:  [96 %-100 %] 98 % (09/12 0550) Weight:  [182 lb (82.6 kg)] 182 lb (82.6 kg) (09/11 1442) Physical Exam: General: Overweight female in NAD resting comfortably in bed.  Neck: no tenderness to palpation over spinous processes, mild tenderness over R paraspinal muscles and R trapezius. No meningismus.  Cardiovascular: sinus tachycardia, no m/r/g, 2+ pedal pulses, no LE edema  Respiratory: CTAB, good air movement, normal WOB  Abdomen: +mildly TTP over lower quadrants;  2 masses palpated over uterus, pt has hx of fibroids, +BS, nondistended Skin: no rashes or wounds  Laboratory:  Recent Labs Lab 02/18/16 0847  WBC 11.2*  HGB 13.3  HCT 40.9  PLT 185    Recent Labs Lab 02/18/16 0847  NA 140  K 4.4  CL 104  CO2 25  BUN 12  CREATININE 0.83  CALCIUM 9.6  PROT 7.0  BILITOT 0.7  ALKPHOS 52  ALT 24  AST 32  GLUCOSE 101*   KSela Hilding MD 02/19/2016, 7:20 AM PGY-1, [place] pager: [phone] text pages welcome

## 2016-02-19 NOTE — Progress Notes (Signed)
Pt complained of Headache and stomach ache at around 10pm. MD notified. Tylenol was ordered for both. Pt reported relief upon reassessment. MD put in PRN Motrin on file if Tylenol becomes ineffective. Will continue to monitor.

## 2016-02-23 LAB — CULTURE, BLOOD (ROUTINE X 2)
Culture: NO GROWTH
Culture: NO GROWTH

## 2016-03-20 DIAGNOSIS — J310 Chronic rhinitis: Secondary | ICD-10-CM | POA: Insufficient documentation

## 2016-06-18 ENCOUNTER — Emergency Department (HOSPITAL_COMMUNITY)
Admission: EM | Admit: 2016-06-18 | Discharge: 2016-06-19 | Disposition: A | Discharge status: Home / Self Care | Payer: PRIVATE HEALTH INSURANCE | Attending: Emergency Medicine | Admitting: Emergency Medicine

## 2016-06-18 ENCOUNTER — Encounter (HOSPITAL_COMMUNITY): Payer: Self-pay | Admitting: Emergency Medicine

## 2016-06-18 DIAGNOSIS — E039 Hypothyroidism, unspecified: Secondary | ICD-10-CM | POA: Insufficient documentation

## 2016-06-18 DIAGNOSIS — R059 Cough, unspecified: Secondary | ICD-10-CM

## 2016-06-18 DIAGNOSIS — R05 Cough: Secondary | ICD-10-CM | POA: Diagnosis present

## 2016-06-18 DIAGNOSIS — Z79899 Other long term (current) drug therapy: Secondary | ICD-10-CM | POA: Diagnosis not present

## 2016-06-18 NOTE — ED Triage Notes (Signed)
Patient reports productive cough and congestion x2 days. Denies fevers, abdominal pain, chest pain, SOB, and N/V/D.

## 2016-06-19 ENCOUNTER — Emergency Department (HOSPITAL_COMMUNITY): Payer: PRIVATE HEALTH INSURANCE

## 2016-06-19 MED ORDER — ALBUTEROL SULFATE HFA 108 (90 BASE) MCG/ACT IN AERS
2.0000 | INHALATION_SPRAY | Freq: Once | RESPIRATORY_TRACT | Status: AC
  Administered 2016-06-19: 2 via RESPIRATORY_TRACT
  Filled 2016-06-19: qty 6.7

## 2016-06-19 NOTE — ED Provider Notes (Signed)
[facility] DEPT Provider Note   CSN: [id number] Arrival date & time: 06/18/16  2145     History   Chief Complaint Chief Complaint  Patient presents with  . Cough    HPI ADAN DECKARD is a 51 y.o. female.  HPI KHYLI WEYER is a 51 y.o. female with history of hypertension, thyroid disease, anemia, presents to emergency department for cough for 2 days. Patient states that tonight she went to bed and felt like she was wheezing so wanted to be evaluated. He denies any history of lung problems. She denies any wheezing in the past. She denies any shortness of breath or chest pain. She states that her cough is nonproductive and she has used over-the-counter cough medication which has not helped. She denies any fever or chills. No sore throat. No difficulty with swallowing or breathing. No voice changes. She states nothing is making her symptoms better or worse. She denies any pain at this time. No sick contacts.   Past Medical History:  Diagnosis Date  . Anemia   . Hypertension   . Hypothyroidism   . Thyroid disease     Patient Active Problem List   Diagnosis Date Noted  . Febrile illness 02/18/2016  . Hypothyroidism 06/23/2014  . Chest pain 06/23/2014  . Elevated blood pressure 06/23/2014  . Blurry vision 06/23/2014    Past Surgical History:  Procedure Laterality Date  . CESAREAN SECTION  1992; 2000  . TUBAL LIGATION  2000  . WRIST SURGERY Right 1994    OB History    No data available       Home Medications    Prior to Admission medications   Medication Sig Start Date End Date Taking? Authorizing Provider  Cholecalciferol (VITAMIN D3 PO) Take 1 tablet by mouth daily.    Historical Provider, MD  docusate sodium (COLACE) 100 MG capsule Take 1 capsule (100 mg total) by mouth 2 (two) times daily. 02/19/16   Sela Hilding, MD  levothyroxine (SYNTHROID, LEVOTHROID) 125 MCG tablet Take 125 mcg by mouth daily before breakfast.    Historical  Provider, MD  Multiple Vitamin (MULTIVITAMIN WITH MINERALS) TABS tablet Take 1 tablet by mouth daily.    Historical Provider, MD  psyllium (REGULOID) 0.52 g capsule Take 0.52 g by mouth daily.    Historical Provider, MD    Family History History reviewed. No pertinent family history.  Social History Social History  Substance Use Topics  . Smoking status: Never Smoker  . Smokeless tobacco: Never Used  . Alcohol use 2.4 oz/week    4 Glasses of wine per week     Allergies   Shellfish allergy   Review of Systems Review of Systems  Constitutional: Negative for chills and fever.  Respiratory: Positive for cough and wheezing. Negative for chest tightness and shortness of breath.   Cardiovascular: Negative for chest pain, palpitations and leg swelling.  Gastrointestinal: Negative for abdominal pain, diarrhea, nausea and vomiting.  Genitourinary: Negative for dysuria, flank pain and pelvic pain.  Musculoskeletal: Negative for arthralgias, myalgias, neck pain and neck stiffness.  Skin: Negative for rash.  Neurological: Negative for dizziness, weakness and headaches.  All other systems reviewed and are negative.    Physical Exam Updated Vital Signs BP 131/84   Pulse 79   Temp 98.1 F (36.7 C) (Oral)   Resp 18   LMP 01/22/2015   SpO2 98%   Physical Exam  Constitutional: She is oriented to person, place, and time. She appears well-developed and  well-nourished. No distress.  HENT:  Head: Normocephalic and atraumatic.  Right Ear: Tympanic membrane, external ear and ear canal normal.  Left Ear: Tympanic membrane, external ear and ear canal normal.  Nose: Mucosal edema and rhinorrhea present.  Mouth/Throat: Uvula is midline and mucous membranes are normal. Posterior oropharyngeal erythema present. No oropharyngeal exudate, posterior oropharyngeal edema or tonsillar abscesses.  Eyes: Conjunctivae are normal.  Neck: Neck supple.  Cardiovascular: Normal rate, regular rhythm,  normal heart sounds and intact distal pulses.   Pulmonary/Chest: Effort normal and breath sounds normal. No respiratory distress. She has no wheezes. She has no rales.  Abdominal: Soft. Bowel sounds are normal. She exhibits no distension. There is no tenderness. There is no rebound.  Musculoskeletal: Normal range of motion. She exhibits no edema.  Neurological: She is alert and oriented to person, place, and time.  Skin: Skin is warm and dry.  Psychiatric: She has a normal mood and affect. Her behavior is normal.  Nursing note and vitals reviewed.    ED Treatments / Results  Labs (all labs ordered are listed, but only abnormal results are displayed) Labs Reviewed - No data to display  EKG  EKG Interpretation None       Radiology Dg Chest 2 View  Result Date: 06/19/2016 CLINICAL DATA:  Productive cough with wheezing and shortness of breath for a few hours. EXAM: CHEST  2 VIEW COMPARISON:  02/18/2016 FINDINGS: The heart size and mediastinal contours are within normal limits. Both lungs are clear. The visualized skeletal structures are unremarkable. IMPRESSION: No active cardiopulmonary disease. Electronically Signed   By: Lucienne Capers M.D.   On: 06/19/2016 00:32    Procedures Procedures (including critical care time)  Medications Ordered in ED Medications  albuterol (PROVENTIL HFA;VENTOLIN HFA) 108 (90 Base) MCG/ACT inhaler 2 puff (2 puffs Inhalation Given 06/19/16 0056)     Initial Impression / Assessment and Plan / ED Course  I have reviewed the triage vital signs and the nursing notes.  Pertinent labs & imaging results that were available during my care of the patient were reviewed by me and considered in my medical decision making (see chart for details).  Clinical Course     Patient in emergency department with wheezing and cough for 2 days. No history of wheezing or any prior lung problems or asthma. On my exam I did not hear any wheezing, but patient states it  has improved. No treatment prior to arrival other than some cough syrup earlier last night. Patient's vital signs are normal. Chest x-ray is negative. Patient will be given inhaler in case of wheezing returns. VS normal. Pt in NAD, non toxic. Afebrile. Not hypoxic. No cp or SOB. Plan to follow-up with primary care doctor as needed. Return precautions discussed.  Vitals:   06/18/16 2204 06/18/16 2333 06/18/16 2334  BP: 118/86  131/84  Pulse: 80 79   Resp: 18    Temp: 98.1 F (36.7 C)    TempSrc: Oral    SpO2: 100% 98%       Final Clinical Impressions(s) / ED Diagnoses   Final diagnoses:  Cough    New Prescriptions New Prescriptions   No medications on file     Jeannett Senior, PA-C 06/19/16 [place], DO 06/19/16 0101

## 2016-06-19 NOTE — Discharge Instructions (Signed)
Use inhaler 2 puffs every 3-4 hours as needed for wheezing and cough. Over-the-counter cough medication as needed. Please follow-up with the family doctor if not improving. Return if worsening symptoms, shortness of breath, any new concerning symptoms.

## 2016-07-12 DIAGNOSIS — K5901 Slow transit constipation: Secondary | ICD-10-CM | POA: Insufficient documentation

## 2016-07-21 DIAGNOSIS — J343 Hypertrophy of nasal turbinates: Secondary | ICD-10-CM | POA: Insufficient documentation

## 2017-01-07 ENCOUNTER — Ambulatory Visit (INDEPENDENT_AMBULATORY_CARE_PROVIDER_SITE_OTHER): Payer: Self-pay | Admitting: Podiatry

## 2017-01-07 ENCOUNTER — Encounter: Payer: Self-pay | Admitting: Podiatry

## 2017-01-07 VITALS — BP 126/78 | HR 63 | Resp 16

## 2017-01-07 DIAGNOSIS — L600 Ingrowing nail: Secondary | ICD-10-CM | POA: Diagnosis not present

## 2017-01-07 NOTE — Progress Notes (Signed)
   Subjective:    Patient ID: Carol [name], female    DOB: 11-Jul-1965, 51 y.o.   MRN: [id number]  HPI Chief Complaint  Patient presents with  . Nail Problem    Left foot; great toe; nail discoloration & Right foot; great toe-lateral side; pt stated, "Thinks nail on the left foot is dead and thinks has an ingrown toenail on the right foot - both nails need to be checked today"      Review of Systems  HENT: Positive for sinus pain.   All other systems reviewed and are negative.      Objective:   Physical Exam        Assessment & Plan:

## 2017-01-07 NOTE — Progress Notes (Signed)
Subjective:    Patient ID: Nonnie Done, female   DOB: 51 y.o.   MRN: [id number]   HPI patient presents with painful ingrown toenail deformity of the right hallux lateral border that's been going on for a while and thick damaged big toenail of the left foot    Review of Systems  All other systems reviewed and are negative.       Objective:  Physical Exam  Cardiovascular: Intact distal pulses.   Musculoskeletal: Normal range of motion.  Neurological: She is alert.  Skin: Skin is warm.  Nursing note and vitals reviewed.  neurovascular status intact muscle strength adequate range of motion within normal limits with patient noted to have a incurvated right hallux lateral border that's painful when pressed with it slight distal redness but no active drainage. Left hallux nails very dystrophic thickened with yellow debris noted     Assessment:    Ingrown toenail deformity right hallux lateral border with dystrophic hallux nail left that's probably traumatic in origin with possibility for fungal infection     Plan:    H&P and both conditions reviewed. Rena focused on the right today and I went ahead and explained ingrown toenail procedure and correction. Risk was explained to patient and patient wants procedure and today the right hallux was infiltrated 60 Milligan times like Marcaine mixture the lateral border was excised and chemical application phenol 3 applications followed by alcohol lavage performed. Sterile dressing applied and reappoint and instructions on soaks given

## 2017-01-07 NOTE — Patient Instructions (Signed)

## 2017-01-12 ENCOUNTER — Ambulatory Visit: Payer: Self-pay | Admitting: Podiatry

## 2017-05-21 DIAGNOSIS — D259 Leiomyoma of uterus, unspecified: Secondary | ICD-10-CM | POA: Insufficient documentation

## 2017-05-21 DIAGNOSIS — R32 Unspecified urinary incontinence: Secondary | ICD-10-CM | POA: Insufficient documentation

## 2017-05-21 DIAGNOSIS — Z9109 Other allergy status, other than to drugs and biological substances: Secondary | ICD-10-CM | POA: Insufficient documentation

## 2017-09-12 IMAGING — DX DG CHEST 2V
2 series · 2 of 2 positions shown · non-contrast
Comparison: 06/23/2014

CLINICAL DATA: Fever

EXAM:
CHEST  2 VIEW

[chest pa]
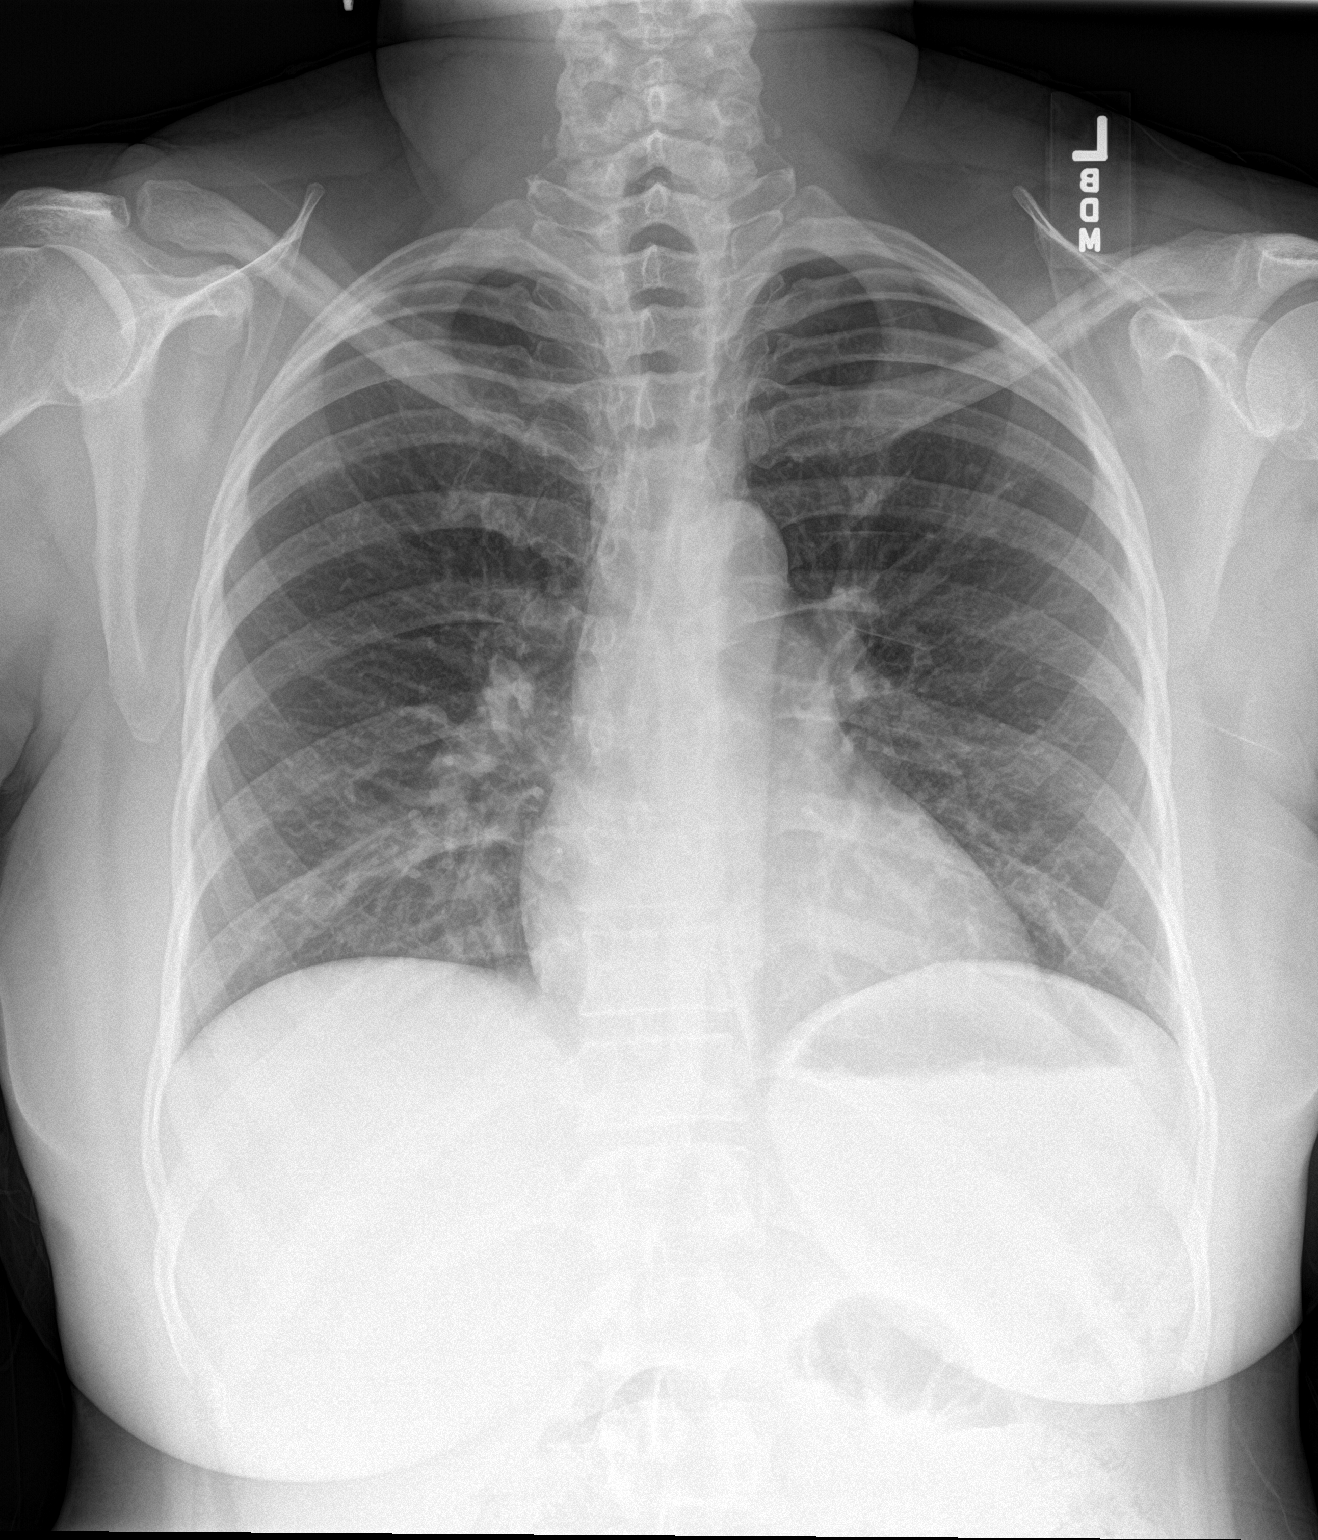

[chest lat]
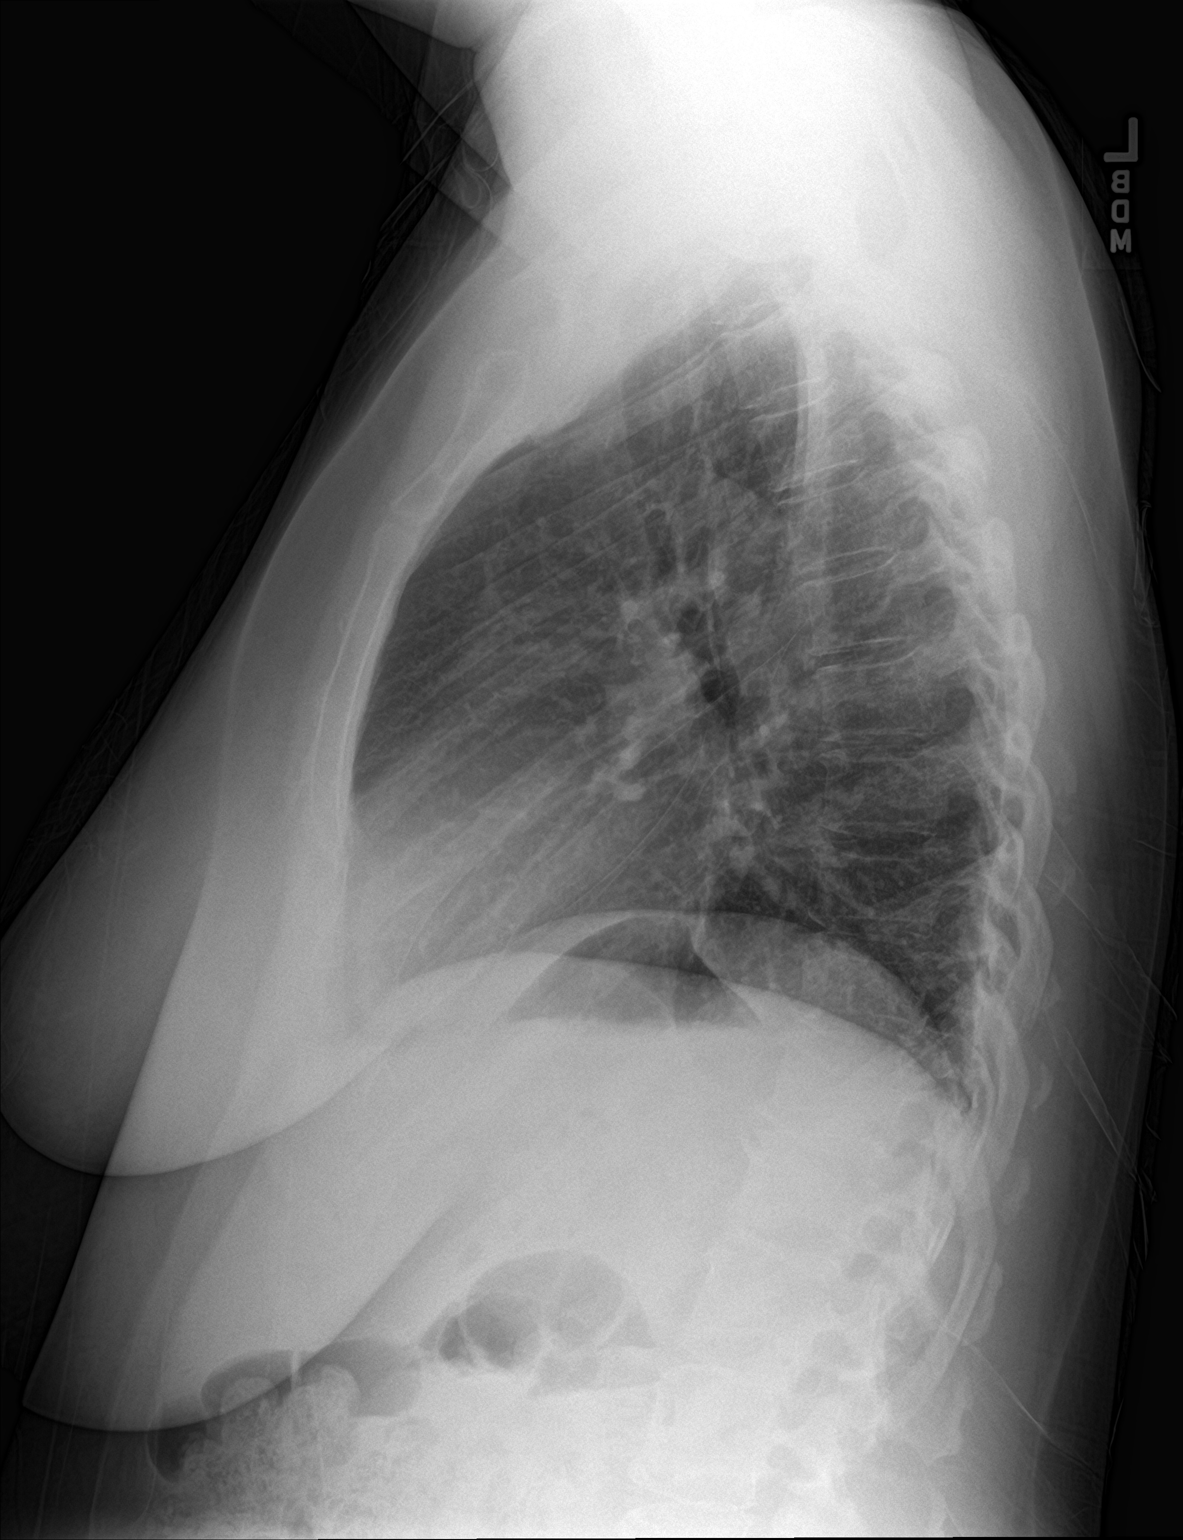

[2 of 2 positions shown; findings below may reference images not displayed]

FINDINGS: The heart size and mediastinal contours are within normal limits.
Both lungs are clear. The visualized skeletal structures are
unremarkable.
IMPRESSION: No active cardiopulmonary disease.

## 2018-01-12 IMAGING — CR DG CHEST 2V
2 series · 2 of 2 positions shown · non-contrast
Comparison: 02/18/2016

CLINICAL DATA: Productive cough with wheezing and shortness of
breath for a few hours.

EXAM:
CHEST  2 VIEW

[w chest pa]
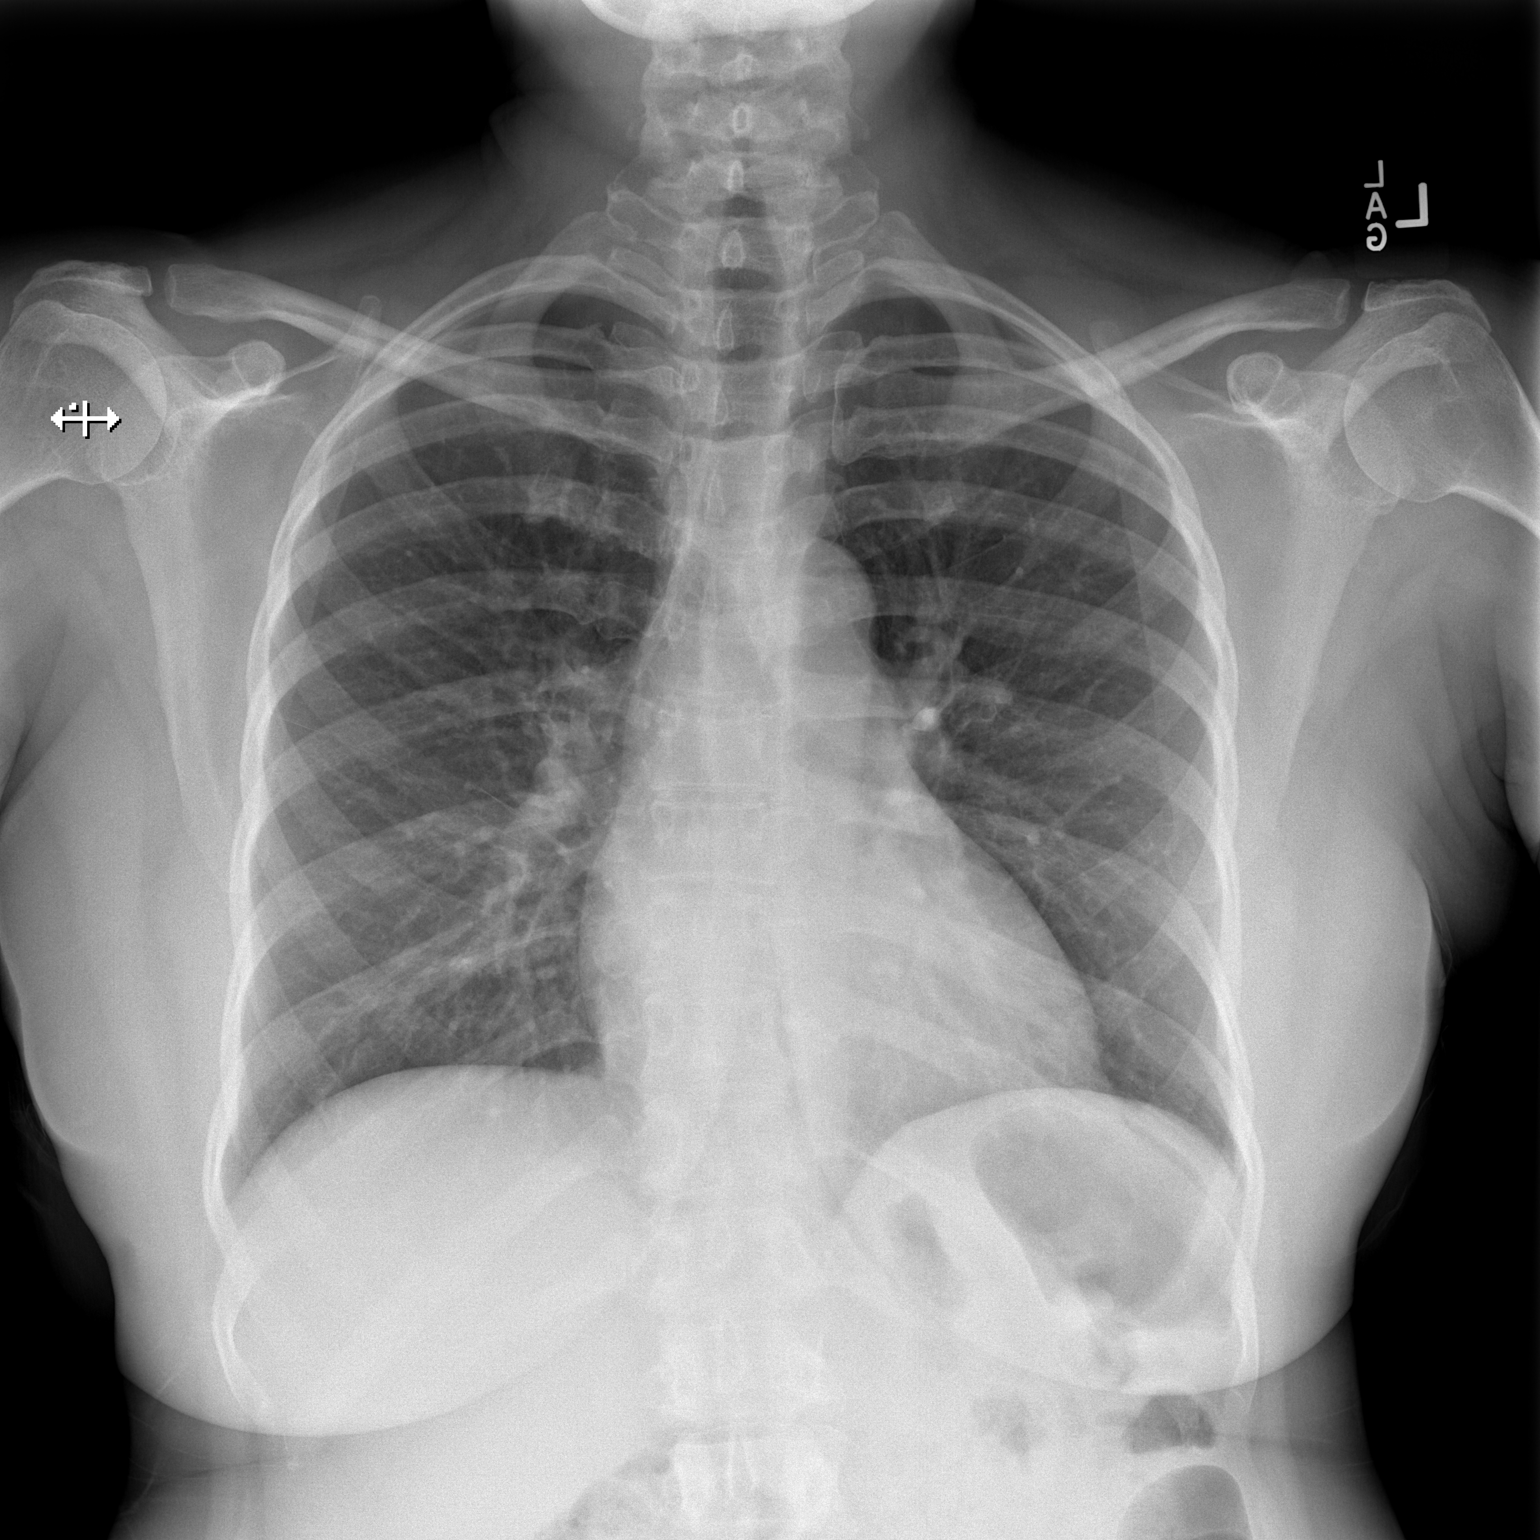

[w chest lat]
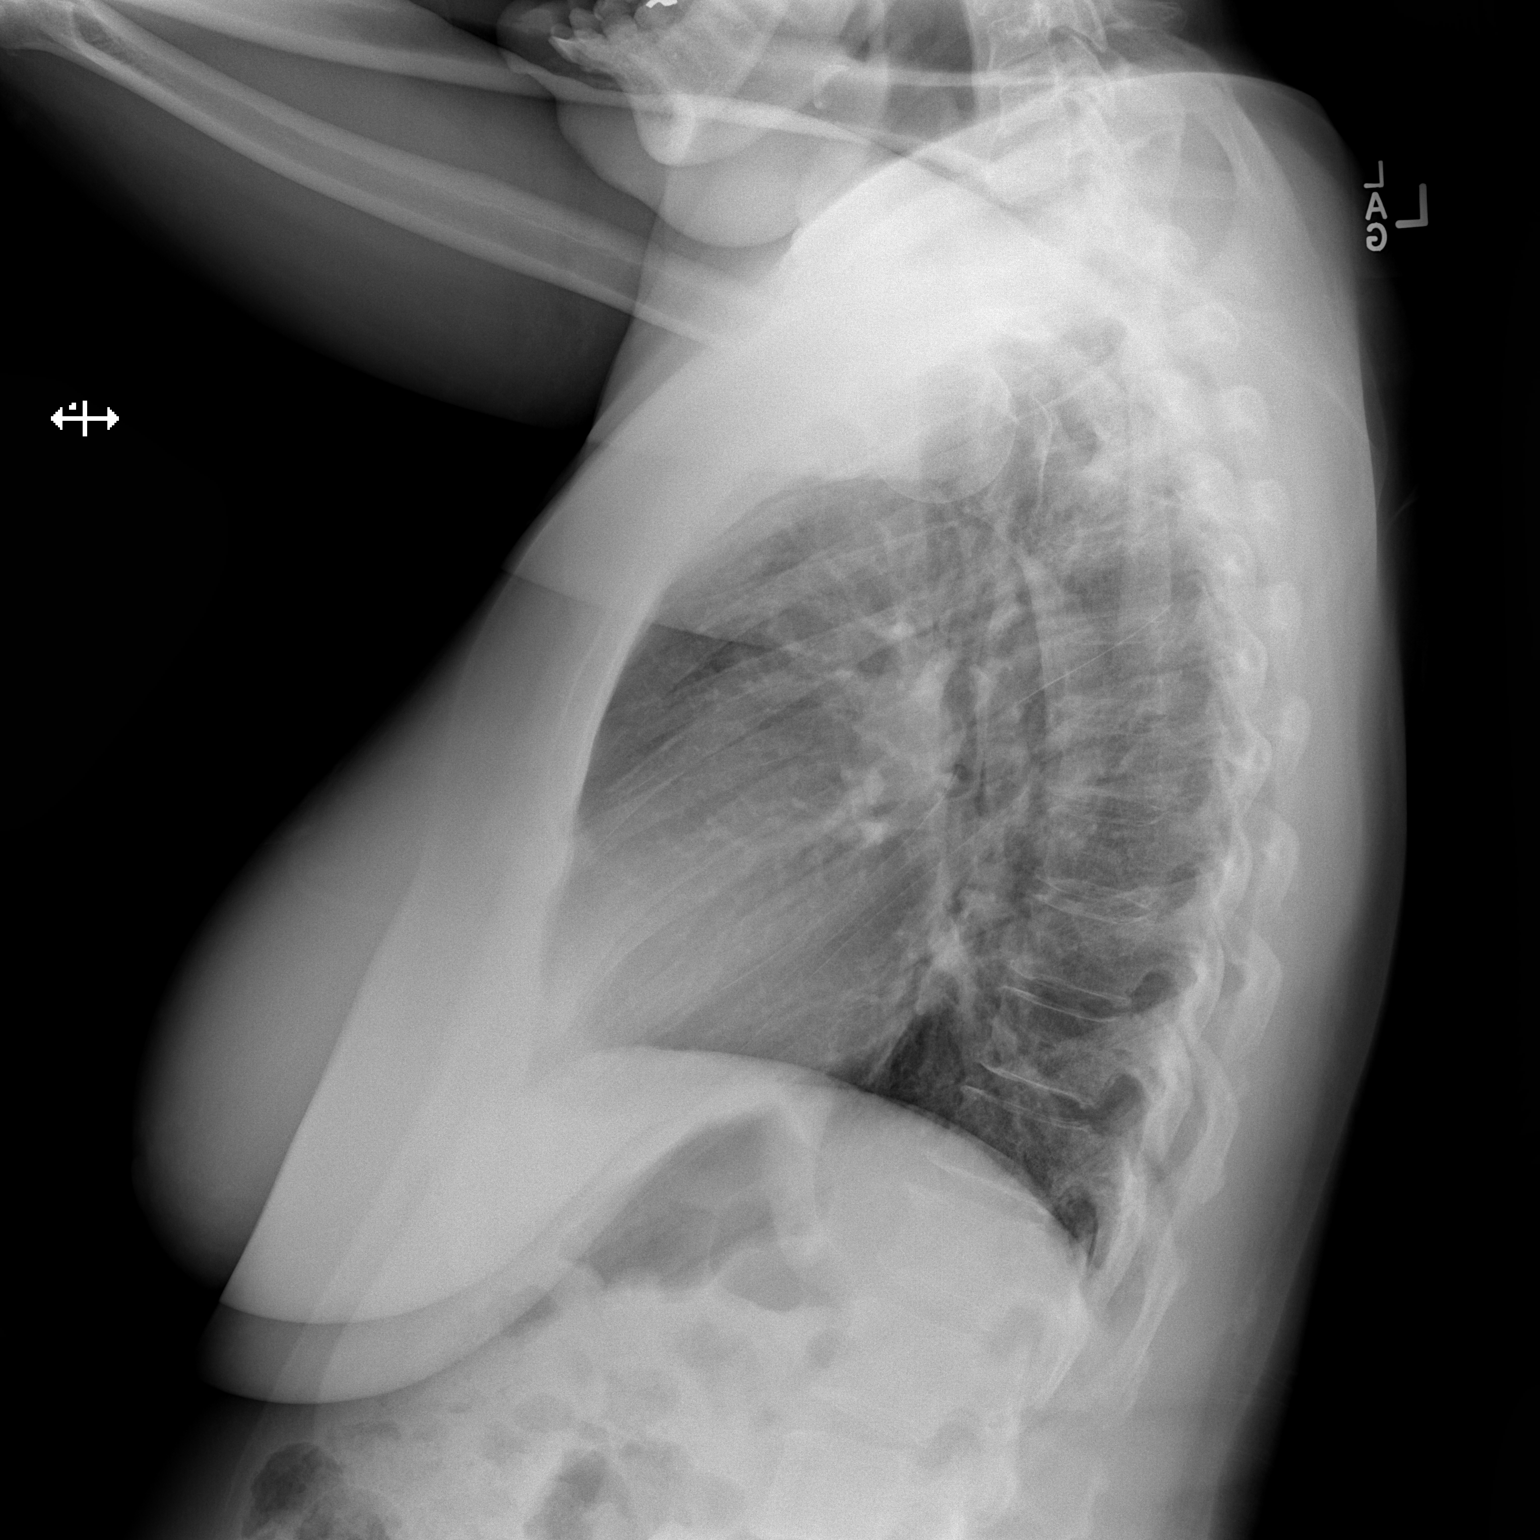

[2 of 2 positions shown; findings below may reference images not displayed]

FINDINGS: The heart size and mediastinal contours are within normal limits.
Both lungs are clear. The visualized skeletal structures are
unremarkable.
IMPRESSION: No active cardiopulmonary disease.

## 2018-01-20 ENCOUNTER — Encounter: Payer: Self-pay | Admitting: Podiatry

## 2018-01-20 ENCOUNTER — Ambulatory Visit: Payer: PRIVATE HEALTH INSURANCE | Admitting: Podiatry

## 2018-01-20 DIAGNOSIS — L600 Ingrowing nail: Secondary | ICD-10-CM

## 2018-01-20 NOTE — Patient Instructions (Signed)

## 2018-01-21 NOTE — Progress Notes (Signed)
Subjective:   Patient ID: Carol [name], female   DOB: 52 y.o.   MRN: [id number]   HPI Patient states her right big toenail is very loose and she does not remember specific trauma that she is aware.  States is been present for several weeks   ROS      Objective:  Physical Exam  Neurovascular status intact with loose hallux nail right with probable previous trauma no active drainage noted     Assessment:  There is right hallux nail with loose component to it and no indication of infection     Plan:  Infiltrated the right hallux 60 mg like Marcaine mixture and with sterile instrumentation removed the nail and created a clean growth plate to allow nail to regrow.  Applied sterile dressing and did explain that a new nail may grow out abnormally and ultimately she may require a permanent procedure

## 2018-01-22 ENCOUNTER — Telehealth: Payer: Self-pay | Admitting: Podiatry

## 2018-01-22 NOTE — Telephone Encounter (Signed)
Spoke with patient and was concerned about the drainage coming from her toe post nail avulsion procedure performed on 01/20/2018.  I advised her that the first few days post procedure it was normal for the toe to drain, and she might have to change the bandage a couple times a day.  Told her to continue with the Epson salt soaks and Neosporin and bandage.  I did discuss with her signs and symptoms of infection and importance of reporting symptoms immediately.  She is to call back with any other questions or concerns.

## 2018-01-22 NOTE — Telephone Encounter (Signed)
Pt had and ingrown toenail removed on her right foot great toe and its bleeding through the gauze and band-aid. Is this normal. Please

## 2018-06-21 ENCOUNTER — Ambulatory Visit: Payer: PRIVATE HEALTH INSURANCE | Admitting: Podiatry

## 2018-06-21 ENCOUNTER — Encounter: Payer: Self-pay | Admitting: Podiatry

## 2018-06-21 DIAGNOSIS — L600 Ingrowing nail: Secondary | ICD-10-CM

## 2018-06-21 MED ORDER — NEOMYCIN-POLYMYXIN-HC 3.5-10000-1 OT SOLN: OTIC | 1 refills | Status: DC

## 2018-06-21 NOTE — Patient Instructions (Signed)

## 2018-06-23 NOTE — Progress Notes (Signed)
Subjective:   Patient ID: Nonnie Done, female   DOB: 53 y.o.   MRN: [id number]   HPI Patient presents stating the left big toenail is worse than it was continues to be a problem and she wants it removed   ROS      Objective:  Physical Exam  Neurovascular status intact with thick dystrophic left hallux nail that is loose and painful with palpation     Assessment:  Thick yellow brittle nail bed left hallux that is painful when pressed and makes wearing shoe gear difficult     Plan:  Reviewed removal and explained procedure and risk.  Patient wants surgery and today I went ahead and explained surgery to patient and I explained risk and patient signed consent for.  I infiltrated the left hallux 60 mg like Marcaine mixture sterile prep applied to the toe using sterile instrumentation remove the hallux nail exposed matrix and applied phenol 5 applications 30 seconds followed by alcohol lavage and sterile dressing.  Gave instructions on soaks reappoint and wrote for Corticosporin otic solution to use postoperatively and instructed to leave dressing on 24 hours but to take it off earlier if it should start to throb.  Patient encouraged to call with any questions concerns she may have and will be checked back in 2 weeks

## 2018-07-05 ENCOUNTER — Other Ambulatory Visit: Payer: PRIVATE HEALTH INSURANCE

## 2018-07-07 ENCOUNTER — Ambulatory Visit (INDEPENDENT_AMBULATORY_CARE_PROVIDER_SITE_OTHER): Payer: Self-pay

## 2018-07-07 DIAGNOSIS — L600 Ingrowing nail: Secondary | ICD-10-CM

## 2018-07-07 NOTE — Patient Instructions (Signed)

## 2018-07-13 NOTE — Progress Notes (Signed)
Patient is here today for follow-up appointment, recent procedure performed on 06/21/2018, removal of left hallux nail.  She states that the toenail feels better than it did before the procedure, and she is not having any pain or complications at this time.  No redness, no swelling, no erythema, no drainage, no other signs and symptoms of infection.  Area is scabbed over at this time and healing well.  Discussed signs and symptoms of infection, verbal and written instructions were given to the patient.  She is to follow-up as needed with any acute symptom changes.

## 2019-06-08 ENCOUNTER — Ambulatory Visit (INDEPENDENT_AMBULATORY_CARE_PROVIDER_SITE_OTHER): Payer: PRIVATE HEALTH INSURANCE | Admitting: Podiatry

## 2019-06-08 ENCOUNTER — Other Ambulatory Visit: Payer: Self-pay

## 2019-06-08 ENCOUNTER — Encounter: Payer: Self-pay | Admitting: Podiatry

## 2019-06-08 DIAGNOSIS — L600 Ingrowing nail: Secondary | ICD-10-CM | POA: Diagnosis not present

## 2019-06-08 MED ORDER — NEOMYCIN-POLYMYXIN-HC 3.5-10000-1 OT SOLN: OTIC | 1 refills | Status: DC

## 2019-06-08 NOTE — Patient Instructions (Signed)

## 2019-06-08 NOTE — Progress Notes (Signed)
Subjective:   Patient ID: Nonnie Done, female   DOB: 53 y.o.   MRN: [id number]   HPI Patient presents stating this right hallux nail is damaged and it is loose and it is making it hard for me to wear shoe gear comfortably.  States is been some drainage under it and the left ones done very well   ROS      Objective:  Physical Exam  Neurovascular status with damaged right hallux nail that is loose and painful with history of permanent correction left hallux nail     Assessment:  Chronic nail disease right hallux with damage nail plate that is not responding conservatively with well healed left 1     Plan:  H&P reviewed condition and at this point I recommended nail removal right.  Explained procedure risk and patient wants surgery and understands risks and signs consent form.  Today I infiltrated the right hallux 60 mg like Marcaine mixture sterile prep applied and using sterile instrumentation I remove the hallux nail exposed matrix and applied phenol 5 applications 30 seconds followed by alcohol lavage and sterile dressing.  Gave instructions on soaks and reappoint to recheck and encouraged to call with questions concerns and begin drop usage at home

## 2021-07-23 ENCOUNTER — Encounter (HOSPITAL_COMMUNITY): Payer: Self-pay | Admitting: *Deleted

## 2021-07-23 ENCOUNTER — Ambulatory Visit (HOSPITAL_COMMUNITY)
Admission: EM | Admit: 2021-07-23 | Discharge: 2021-07-23 | Disposition: A | Discharge status: Home / Self Care | Payer: 59 | Attending: Emergency Medicine | Admitting: Emergency Medicine

## 2021-07-23 ENCOUNTER — Ambulatory Visit (INDEPENDENT_AMBULATORY_CARE_PROVIDER_SITE_OTHER): Payer: 59

## 2021-07-23 ENCOUNTER — Other Ambulatory Visit: Payer: Self-pay

## 2021-07-23 DIAGNOSIS — R6884 Jaw pain: Secondary | ICD-10-CM | POA: Diagnosis not present

## 2021-07-23 DIAGNOSIS — M25511 Pain in right shoulder: Secondary | ICD-10-CM

## 2021-07-23 NOTE — Discharge Instructions (Signed)
Your x-ray today did not show injury to the boneof your shoulder. Your pain is most likely being caused by irritation to the soft tissues, this should improve as time progresses.   You may ibuprofen 600-800 mg every 6-8 hours for inflammation and pain, you may use tylenol in addition   You may apply heat or ice, whichever makes you feel better, to affected area in 15 minute intervals  You may continue activity as tolerated, there is no injury therefore, it is important that you continue to move around so you do not loose strength to the area  If symptoms persist past 2 weeks, you may follow up at urgent care or with orthopedic specialist for evaluation, an orthopedic doctor specializes in the bone, they may provide  management such as but not limited to imaging, long term medications and physical therapy

## 2021-07-23 NOTE — ED Provider Notes (Signed)
[facility]    CSN: [id number] Arrival date & time: 07/23/21  1450      History   Chief Complaint Chief Complaint  Patient presents with   Fall    HPI Carol [name] is a 56 y.o. female.   Patient presents with right shoulder pain and right lower jaw pain after a fall occurring 3 days ago.  Endorses that she was returning from the bathroom in the middle the night when she felt lightheaded and fell.  Denies hitting head or loss of consciousness.  Pain in face and shoulder are described as soreness.  Pain in jaw does not radiate, no associated numbness or tingling.  No pain is felt with chewing or movement of the mouth.  No difficulty swallowing.  Range of motion of shoulder is intact.  No associated numbness or tingling.  Has attempted use of Tylenol which has been somewhat helpful.  History of anemia, hypertension, hypothyroidism.    Past Medical History:  Diagnosis Date   Anemia    Hypertension    Hypothyroidism    Thyroid disease     Patient Active Problem List   Diagnosis Date Noted   Environmental allergies 05/21/2017   Urinary incontinence 05/21/2017   Uterine leiomyoma 05/21/2017   Nasal turbinate hypertrophy 07/21/2016   Constipation by delayed colonic transit 07/12/2016   Chronic rhinitis 03/20/2016   Febrile illness 02/18/2016   Colon cancer screening 11/12/2015   Macrocytosis without anemia 10/31/2015   Abdominal pain, bilateral upper quadrant 08/02/2015   Acute recurrent pansinusitis 08/02/2015   Benign essential hypertension 08/02/2015   Callus of foot 08/02/2015   Cough 08/02/2015   Intolerance to fatty food 08/02/2015   Urticaria, idiopathic 07/19/2015   Hypothyroidism 06/23/2014   Chest pain 06/23/2014   Elevated blood pressure 06/23/2014   Blurry vision 06/23/2014   Hypothyroidism, 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      Home Medications    Prior to Admission medications   Medication Sig Start Date End Date Taking? Authorizing Provider  Cholecalciferol (VITAMIN D3 PO) Take 1 tablet by mouth daily.    [provider]  levothyroxine (SYNTHROID, LEVOTHROID) 112 MCG tablet Take 1 tablet on Monday , Wednesday and Fridays 11/19/16   [provider]  lisinopril (PRINIVIL,ZESTRIL) 10 MG tablet Take 10 mg by mouth daily.    [provider]  Multiple Vitamin (MULTIVITAMIN WITH MINERALS) TABS tablet Take 1 tablet by mouth daily.    [provider]  neomycin-polymyxin-hydrocortisone (CORTISPORIN) OTIC solution Apply 1-2 drops to toe after soaking BID 06/21/18   Wallene Huh, DPM  neomycin-polymyxin-hydrocortisone (CORTISPORIN) OTIC solution Apply 1-2 drops to toe after soaking BID 06/08/19   Regal, Tamala Fothergill, DPM  fexofenadine (ALLEGRA) 180 MG tablet Take 1 tablet (180 mg total) by mouth daily. Patient not taking: Reported on 06/23/2014 08/01/12 10/20/14  Billy Fischer, MD  fluticasone [facility]) 50 MCG/ACT nasal spray Place 1 spray into the nose 2 (two) times daily. Patient not taking: Reported on 06/23/2014 08/01/12 10/20/14  Billy Fischer, MD    Family History History reviewed. No pertinent family history.  Social History Social History   Tobacco Use   Smoking status: Never   Smokeless tobacco: Never  Substance Use Topics   Alcohol use: Yes  Alcohol/week: 4.0 standard drinks    Types: 4 Glasses of wine per week   Drug use: No     Allergies   Shellfish allergy, Sulfa antibiotics, and Levonorgestrel-ethinyl estrad   Review of Systems Review of Systems  Constitutional: Negative.   Respiratory: Negative.    Cardiovascular: Negative.   Skin: Negative.   Neurological: Negative.     Physical Exam Triage Vital Signs ED Triage Vitals  Enc Vitals Group     BP      Pulse      Resp      Temp       Temp src      SpO2      Weight      Height      Head Circumference      Peak Flow      Pain Score      Pain Loc      Pain Edu?      Excl. in [place]?    No data found.  Updated Vital Signs LMP 01/22/2015   Visual Acuity Right Eye Distance:   Left Eye Distance:   Bilateral Distance:    Right Eye Near:   Left Eye Near:    Bilateral Near:     Physical Exam Constitutional:      Appearance: Normal appearance.  HENT:     Head:     Comments: Mild swelling and tenderness along the right lower jawline, with mild bruising on the right aspect of the chin. Eyes:     Extraocular Movements: Extraocular movements intact.  Pulmonary:     Effort: Pulmonary effort is normal.  Musculoskeletal:     Comments: Tenderness along the anterior aspect of the right upper arm, no point tenderness noted throughout the shoulder, tenderness over the trapezius muscle, no ecchymosis, deformity, swelling noted, range of motion intact, negative Hawkins sign  Skin:    General: Skin is warm and dry.  Neurological:     Mental Status: She is alert and oriented to person, place, and time. Mental status is at baseline.  Psychiatric:        Mood and Affect: Mood normal.        Behavior: Behavior normal.     UC Treatments / Results  Labs (all labs ordered are listed, but only abnormal results are displayed) Labs Reviewed - No data to display  EKG   Radiology No results found.  Procedures Procedures (including critical care time)  Medications Ordered in UC Medications - No data to display  Initial Impression / Assessment and Plan / UC Course  I have reviewed the triage vital signs and the nursing notes.  Pertinent labs & imaging results that were available during my care of the patient were reviewed by me and considered in my medical decision making (see chart for details).  Acute pain of the right shoulder Jaw pain  Shoulder x-ray negative, discussed findings with patient, etiology of  symptoms is most likely muscular in nature due to recent fall, advised patient to watchful wait for resolution, may attempt use of over-the-counter ibuprofen and Tylenol for management of pain, recommended RICE, heat in 15-minute intervals daily stretching, activity as tolerated, given walking referral to orthopedics if pain continues to persist Final Clinical Impressions(s) / UC Diagnoses   Final diagnoses:  None   Discharge Instructions   None    ED Prescriptions   None    PDMP not reviewed this encounter.   Hans Eden, NP 07/23/21 1744

## 2021-07-23 NOTE — ED Triage Notes (Signed)
Pt reports falling onto Rt side Saturday night. Pt has swelling to Rt side of face and Rt shoulder pain. Pt reports falling as she was walking out bathroom into bed room . Pt reports remembering the fall.

## 2021-08-03 ENCOUNTER — Other Ambulatory Visit: Payer: Self-pay | Admitting: Physician Assistant

## 2021-08-03 DIAGNOSIS — N644 Mastodynia: Secondary | ICD-10-CM

## 2021-08-16 ENCOUNTER — Other Ambulatory Visit: Payer: PRIVATE HEALTH INSURANCE

## 2021-09-10 ENCOUNTER — Other Ambulatory Visit: Payer: PRIVATE HEALTH INSURANCE

## 2021-10-08 ENCOUNTER — Ambulatory Visit
Admission: RE | Admit: 2021-10-08 | Discharge: 2021-10-08 | Disposition: A | Discharge status: Home / Self Care | Payer: Commercial Managed Care - PPO | Source: Ambulatory Visit | Attending: Physician Assistant | Admitting: Physician Assistant

## 2021-10-08 ENCOUNTER — Ambulatory Visit: Payer: PRIVATE HEALTH INSURANCE

## 2021-10-08 DIAGNOSIS — N644 Mastodynia: Secondary | ICD-10-CM

## 2021-11-15 ENCOUNTER — Ambulatory Visit (INDEPENDENT_AMBULATORY_CARE_PROVIDER_SITE_OTHER): Payer: Commercial Managed Care - PPO | Admitting: Podiatry

## 2021-11-15 ENCOUNTER — Encounter: Payer: Self-pay | Admitting: Podiatry

## 2021-11-15 DIAGNOSIS — L600 Ingrowing nail: Secondary | ICD-10-CM

## 2021-11-15 NOTE — Progress Notes (Signed)
Subjective:   Patient ID: Carol Carol [name], female   DOB: 56 y.o.   MRN: [id number]   HPI Patient states she has had a regrowth of the nail that was removed 2-1/2 years ago.  States that it did very well for an extended period of time and then over the last 6 months there is been growth that it is grown in an abnormal direction in a more dorsal position.  It is thick and dystrophic and it is painful at times   ROS      Objective:  Physical Exam  Neurovascular status intact abnormal growth of the right hallux that is thick and dystrophic and is in a dorsal direction     Assessment:  Unfortunate regrowth which is unusual to see after 2 years but there must of been migration of cells which created abnormal growth of the nailbed     Plan:  H&P reviewed condition and recommended long-term removal of the nail.  She wants to have this Carol [name] but she has a trip to [place] and cannot do it yet and today active debridement was accomplished and I smoothed the nailbed off and patient will be seen back for permanent procedure

## 2022-11-10 ENCOUNTER — Encounter: Payer: Self-pay | Admitting: Podiatry

## 2022-11-10 ENCOUNTER — Ambulatory Visit (INDEPENDENT_AMBULATORY_CARE_PROVIDER_SITE_OTHER): Payer: Commercial Managed Care - PPO | Admitting: Podiatry

## 2022-11-10 VITALS — Ht 65.0 in | Wt 182.0 lb

## 2022-11-10 DIAGNOSIS — L600 Ingrowing nail: Secondary | ICD-10-CM | POA: Diagnosis not present

## 2022-11-10 NOTE — Progress Notes (Signed)
Subjective:   Patient ID: Carol [name], female   DOB: 57 y.o.   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  I gave instructions on being careful with it and I do think it should be okay for now

## 2024-03-07 ENCOUNTER — Encounter: Payer: Self-pay | Admitting: Internal Medicine

## 2024-03-07 ENCOUNTER — Ambulatory Visit: Admitting: Internal Medicine

## 2024-03-07 ENCOUNTER — Other Ambulatory Visit: Payer: Self-pay

## 2024-03-07 VITALS — BP 136/80 | HR 66 | Temp 98.1°F | Ht 65.0 in | Wt 194.2 lb

## 2024-03-07 DIAGNOSIS — L508 Other urticaria: Secondary | ICD-10-CM | POA: Insufficient documentation

## 2024-03-07 NOTE — Patient Instructions (Addendum)
 Chronic idiopathic urticaria Chronic idiopathic urticaria with intermittent episodes of daily hives since age 58, exacerbated post-COVID vaccination. Hives present on neck, arms, legs, buttocks, and occasionally face with lip swelling. No response to high-dose antihistamines or Xolair (omalizumab) 300 mg monthly since April. 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 - Order blood work to assess IGE levels and autoimmune markers. - Continue current Xolair dosing and Zyrtec 20 mg BID until blood work results are available.  - Evaluate eligibility for increased Xolair dosing based on copay assistance status as well as likelihood of response based on labs (suspect more autoimmune phenotype and likely better responder to cyclosporine or tacrolimus) - Consider starting low-dose tacrolimus if blood work indicates autoimmune profile and Xolair is ineffective.  Follow up : 3 months, sooner if needed-will contact once results are available It was a pleasure meeting you in clinic today! Thank you for allowing me to participate in your care.  Rocky Endow, MD [facility]

## 2024-03-07 NOTE — Progress Notes (Signed)
 NEW PATIENT Date of Service/Encounter:   03/07/2024 Referring provider: Trudy Rexene SAUNDERS, NP Primary care provider: Asuncion Charleston, MD  Subjective:  Carol [name] is a 58 y.o. female with a PMHx of HTN, hypothyroidism, hypercholesterolemia, IBS presenting today for evaluation of chronic urticaria, transfer of care from outside allergist ([facility]). History obtained from: chart review and patient.   Discussed the use of AI scribe software for clinical note transcription with the patient, who gave verbal consent to proceed.  History of Present Illness Carol [name] is a 58 year old female who presents with persistent chronic hives. She was referred by a physician at urgent care to a specialist for further evaluation of her chronic hives.  Chronic urticaria and angioedema - Persistent daily hives since approximately age 43, formally diagnosed at the beginning of the year - Hives primarily located on neck, arms, legs, and buttocks; occasionally involve face, lips, and soles of feet - Episodes described as severe, with swelling under the skin (angioedema), but without airway compromise - No history of seasonal allergies, asthma, eczema, or medication allergies - No significant allergic reactions to foods, but diet is limited to salads due to concern for potential triggers - hives did seem to worsen again following COVID-19 injection series  Therapeutic response and medication history - Initial treatment with Zyrtec up to four times daily without symptom resolution - Multiple urgent care visits for cortisone injections and prednisone, providing only temporary relief - Monthly Xolair injections (300 mg) initiated in April with no improvement in symptoms  Impact on daily functioning - 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    Past Medical History: Past Medical History:  Diagnosis Date   Anemia    Hypertension    Hypothyroidism    Thyroid  disease    Medication List:  Current Outpatient Medications  Medication Sig Dispense Refill   Cholecalciferol  (VITAMIN D3 PO) Take 1 tablet by mouth daily.     EPINEPHrine 0.3 mg/0.3 mL IJ SOAJ injection Inject 0.3 mg into the muscle as needed.     famotidine (PEPCID) 40 MG tablet Take 40 mg by mouth daily.     fexofenadine  (ALLEGRA ) 180 MG tablet Take 180 mg by mouth daily.     fluticasone  (FLONASE ) 50 MCG/ACT nasal spray 1 spray 2 (two) times daily. 1 g 2   GARLIC PO Take 1 capsule by mouth daily.     levothyroxine  (SYNTHROID , LEVOTHROID) 112 MCG tablet Take 125 mcg by mouth daily before breakfast.     lisinopril  (PRINIVIL ,ZESTRIL ) 10 MG tablet Take 10 mg by mouth daily.     Multiple Vitamin (MULTIVITAMIN WITH MINERALS) TABS tablet Take 1 tablet by mouth daily.     valACYclovir (VALTREX) 500 MG tablet Take 500 mg by mouth as needed.     loratadine (CLARITIN) 10 MG tablet Take 10 mg by mouth daily. (Patient not taking: Reported on 03/07/2024)     neomycin -polymyxin-hydrocortisone (CORTISPORIN) OTIC solution Apply 1-2 drops to toe after soaking BID (Patient not taking: Reported on 03/07/2024) 10 mL 1   neomycin -polymyxin-hydrocortisone (CORTISPORIN) OTIC  solution Apply 1-2 drops to toe after soaking BID (Patient not taking: Reported on 03/07/2024) 10 mL 1   No current facility-administered medications for this visit.   Known Allergies:  Allergies  Allergen Reactions   Shellfish Allergy Hives    Crab    Sulfa Antibiotics Hives     Crab   Levonorgestrel-Ethinyl Estrad Rash   Past Surgical History: Past Surgical History:  Procedure Laterality Date   CESAREAN SECTION  1992; 2000   TUBAL LIGATION  2000   WRIST SURGERY Right 1994   Family History: History reviewed. No pertinent family history. 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  Home built 25 years ago, hardwood floors, electric heating, central AC, no pets, no smoke exposure.  No HEPA filter in the home.  [place]  Non-smoker.   ROS:  All other systems negative except as noted per HPI.  Objective:  Blood pressure 136/80, pulse 66, temperature 98.1 F (36.7 C), temperature source Temporal, height 5' 5 (1.651 m), weight 194 lb 3.2 oz (88.1 kg), last menstrual period 01/22/2015, SpO2 99%. Body mass index is 32.32 kg/m. Physical Exam:  General Appearance:  Alert, cooperative, no distress, appears stated age  Head:  Normocephalic, without obvious abnormality, atraumatic  Eyes:  Conjunctiva clear, EOM's intact  Ears EACs normal bilaterally and normal TMs bilaterally  Nose: Nares normal, hypertrophic turbinates, normal mucosa, no visible anterior polyps, and septum midline  Throat: Lips, tongue normal; teeth and gums normal, normal posterior oropharynx  Neck: Supple, symmetrical  Lungs:   clear to auscultation bilaterally, Respirations unlabored, no coughing  Heart:  regular rate and rhythm and no murmur, Appears well perfused  Extremities: No edema  Skin: Skin color, texture, turgor normal and no rashes or lesions on visualized portions of skin  Neurologic: No gross deficits   Diagnostics:  Labs:  Lab Orders         CBC with Differential/Platelet         CMP14+EGFR         TSH + free T4         Thyroid  antibodies (Thyroperoxidase & Thyroglobulin)         Tryptase         Chronic Urticaria PD-BAT         IgE       Assessment and Plan  Assessment and Plan Assessment & Plan Chronic  idiopathic urticaria-not controlled on Xolair Chronic idiopathic urticaria with intermittent episodes of daily hives since age 31, exacerbated post-COVID vaccination. Hives present on neck, arms, legs, buttocks, and occasionally face with lip swelling. No response to high-dose antihistamines or Xolair (omalizumab) 300 mg monthly since April. 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 - Order blood work to assess IGE levels and autoimmune markers. - Continue current Xolair dosing and Zyrtec 20 mg BID until blood work results are available.  - Evaluate eligibility for increased Xolair dosing based on copay assistance status as well as likelihood of response based on labs (suspect more autoimmune phenotype and likely better responder to cyclosporine or tacrolimus) - Consider starting low-dose tacrolimus if blood work indicates autoimmune profile and Xolair is ineffective.  Follow up : 3 months, sooner if needed-will contact once results are available It was a pleasure meeting you in clinic today! Thank you for allowing me to participate in your care.  Rocky Endow, MD [facility]    This note in its entirety was forwarded to the Provider who requested this consultation.  Other: none  Thank you for your kind referral. I appreciate the opportunity to take part in Carol [name]'s care. Please do not hesitate to contact me with questions.  Sincerely,  Rocky Endow, MD [facility] 

## 2024-03-11 ENCOUNTER — Ambulatory Visit: Payer: Self-pay | Admitting: Internal Medicine

## 2024-03-11 LAB — CMP14+EGFR
ALT: 17 IU/L (ref 0–32)
AST: 21 IU/L (ref 0–40)
Albumin: 4.5 g/dL (ref 3.8–4.9)
Alkaline Phosphatase: 68 IU/L (ref 49–135)
BUN/Creatinine Ratio: 11 (ref 9–23)
BUN: 9 mg/dL (ref 6–24)
Bilirubin Total: 0.4 mg/dL (ref 0.0–1.2)
CO2: 23 mmol/L (ref 20–29)
Calcium: 10.2 mg/dL (ref 8.7–10.2)
Chloride: 104 mmol/L (ref 96–106)
Creatinine, Ser: 0.8 mg/dL (ref 0.57–1.00)
Globulin, Total: 2.2 g/dL (ref 1.5–4.5)
Glucose: 74 mg/dL (ref 70–99)
Potassium: 3.9 mmol/L (ref 3.5–5.2)
Sodium: 143 mmol/L (ref 134–144)
Total Protein: 6.7 g/dL (ref 6.0–8.5)
eGFR: 85 mL/min/1.73 (ref >59)

## 2024-03-11 LAB — CBC WITH DIFFERENTIAL/PLATELET
Basophils Absolute: 0 x10E3/uL (ref 0.0–0.2)
Basos: 0 %
EOS (ABSOLUTE): 0 x10E3/uL (ref 0.0–0.4)
Eos: 0 %
Hematocrit: 41 % (ref 34.0–46.6)
Hemoglobin: 13.2 g/dL (ref 11.1–15.9)
Immature Grans (Abs): 0 x10E3/uL (ref 0.0–0.1)
Immature Granulocytes: 0 %
Lymphocytes Absolute: 3 x10E3/uL (ref 0.7–3.1)
Lymphs: 44 %
MCH: 33.8 pg — ABNORMAL HIGH (ref 26.6–33.0)
MCHC: 32.2 g/dL (ref 31.5–35.7)
MCV: 105 fL — ABNORMAL HIGH (ref 79–97)
Monocytes Absolute: 0.4 x10E3/uL (ref 0.1–0.9)
Monocytes: 6 %
Neutrophils Absolute: 3.3 x10E3/uL (ref 1.4–7.0)
Neutrophils: 50 %
Platelets: 229 x10E3/uL (ref 150–450)
RBC: 3.91 x10E6/uL (ref 3.77–5.28)
RDW: 11.8 % (ref 11.7–15.4)
WBC: 6.7 x10E3/uL (ref 3.4–10.8)

## 2024-03-11 LAB — TSH+FREE T4
Free T4: 1.44 ng/dL (ref 0.82–1.77)
TSH: 0.979 u[IU]/mL (ref 0.450–4.500)

## 2024-03-11 LAB — THYROID ANTIBODIES (THYROPEROXIDASE & THYROGLOBULIN)
Thyroglobulin Antibody: <1 [IU]/mL (ref 0.0–0.9)
Thyroperoxidase Ab SerPl-aCnc: 24 [IU]/mL (ref 0–34)

## 2024-03-11 LAB — IGE: IgE (Immunoglobulin E), Serum: 258 [IU]/mL (ref 6–495)

## 2024-03-11 LAB — TRYPTASE: Tryptase: 6.8 ug/L (ref 2.2–13.2)

## 2024-03-11 LAB — CHRONIC URTICARIA PD-BAT: Pooled Donor- BAT CU: 11.2 % — AB (ref 0.00–10.60)

## 2024-03-11 NOTE — Progress Notes (Signed)
 Please let Ms. Brown-Jones know that her hives labs have returned.   Everything looks reassuring including her thyroid  studies.  Her BAT-CU which looks to see if she has the autoimmune form of hives (which we suspected she has because she already has one autoimmune disease) is elevated. This means her body is making an antibody towards her allergy cells, and that is why she is having hives.  Since Xolair is not helping, we do have a new medication that was recently approved for hives. It is an oral medication that you would take daily. Name is Rhapsido.  We do not have any pamphlets to send as it was just approved by FDA yesterday, but you can look online at information on [email or web address]-[email or web address].   We can discuss this at follow-up if she is interested in switching from Xolair. We also discussed increasing her dose of Xolair and she was going to look at coverage. I am not sure increasing the dose will be helpful to her based on her lack of response currently, but we can trial if she is interested.

## 2024-03-15 NOTE — Progress Notes (Signed)
 Subjective: Patient ID:  Carol [name] is a 58 y.o. female  Chief Complaint  Patient presents with  . Hives    Hives for x 4 days. Has been using benadryl and zyrtec with some relief but worsened today. Ongoing issue currently seeing an allergist. Concerned due to reaction today    The following information was reviewed by members of the visit team:  Tobacco  Allergies  Meds  Problems  Med Hx  Surg Hx  OB Status   Fam Hx    58 year old female presents for evaluation of symptoms ongoing about 4 days.  She reports a hx of chronic hives, and is seeing allergy at this time.  She has not discovered a trigger and reports she does take cetirizine and famotidine daily.  She has tried benadryl as well and this is not improving.  She complains of an itching rash all over.  She denies any difficulty breathing or swallowing. She does not appear to be in distress during this exam.      Review of Systems  Constitutional:  Negative for chills and fever.  Respiratory:  Negative for shortness of breath.   Cardiovascular:  Negative for chest pain.  Gastrointestinal:  Negative for abdominal pain, diarrhea, nausea and vomiting.  Genitourinary:  Negative for dysuria.  Skin:  Positive for rash.  Neurological:  Negative for dizziness and headaches.      Objective  Vitals:   03/15/24 1946  BP: 135/83  Pulse: 66  Resp: 20  Temp: 98 F (36.7 C)  TempSrc: Tympanic  SpO2: 97%  Weight: 87.1 kg (192 lb)  Height: 1.651 m (5' 5)    No LMP recorded. Patient is postmenopausal.   Behavioral Health Screening  Patient Health Questionnaire-2 Score: 0 (03/15/2024  7:49 PM)      Patient's Depression screening is Negative   Depression Plan: Normal/Negative Screening  Physical Exam Vitals and nursing note reviewed.  Constitutional:      Appearance: Normal appearance. She is normal weight.  HENT:     Mouth/Throat:     Mouth: Mucous membranes are moist.  Cardiovascular:      Rate and Rhythm: Normal rate and regular rhythm.     Heart sounds: Normal heart sounds.  Pulmonary:     Effort: Pulmonary effort is normal.     Breath sounds: Normal breath sounds.  Skin:    General: Skin is warm and dry.     Capillary Refill: Capillary refill takes 2 to 3 seconds.     Findings: Rash present. Rash is urticarial.     Comments: Urticarial rash noted bilateral arms, neck and chest  Neurological:     General: No focal deficit present.     Mental Status: She is alert and oriented to person, place, and time.  Psychiatric:        Mood and Affect: Mood normal.        Behavior: Behavior normal.      No results found for this or any previous visit (from the past 24 hours).   Radiologist interpretation:   No orders to display     Assessment/Plan   Rowe was seen today for hives.  Diagnoses and all orders for this visit:  Chronic urticaria -     dexAMETHasone (DECADRON) injection 10 mg -     predniSONE (DELTASONE) 20 mg tablet; Take 2 tablets (40 mg total) by mouth daily for 5 days.    Patient has been instructed on RX/ OTC medications, dosages,  side effects, and possible interactions as associated with each diagnosis in my impression and plan above.  2.   Patient education (verbal/handout) given on diagnosis, pathophysiology, treatment of diagnosis, side effects of medication use for treatment, restrictions while taking medication.  Supportive       Care measures as directed on AVS.  Red Flags associated with diagnosis/es were reviewed and patient instructed on action plan if red flags develop.  3.   Urgent Care Disposition:  Follow up with PCP       They have been instructed that if symptoms worse that should go to Urgent Care, go to the nearest ED, or activate EMS.  4.   Patient agreed with plan and voiced understanding.  NO barriers to adherence perceived by myself.  Electronically signed: Rosaline Jama Collet FNP  Tue 03/15/2024 9:23 PM

## 2024-03-16 NOTE — Telephone Encounter (Signed)
 Please let her know, as previously mentioned, she does qualify for a new oral pill call Rhapisido. If she would like to start this medication, we can start the process for ordering.  This is a tablet that is taken twice daily.  A rare side effect is increased risk of bleeding, so you would have to stop the medication prior to any surgeries. You are also not advised to receive any live vaccines while on this medication.  Please let me know if you would like for us  to submit for this medication at this time.

## 2024-05-27 ENCOUNTER — Other Ambulatory Visit: Payer: Self-pay | Admitting: Obstetrics and Gynecology

## 2024-05-27 DIAGNOSIS — Z1231 Encounter for screening mammogram for malignant neoplasm of breast: Secondary | ICD-10-CM

## 2024-06-05 NOTE — Progress Notes (Deleted)
 "  Follow Up Note  RE: Carol [name] MRN: [id number] DOB: March 08, 1966 Date of Office Visit: 06/06/2024  Referring provider: Asuncion Charleston, MD Primary care provider: Asuncion Charleston, MD  Chief Complaint: No chief complaint on file.  History of Present Illness: I had the pleasure of seeing Carol [name] for a follow up visit at the [facility] on 06/06/2024. She is a 58 y.o. female, who is being followed for CIU. Her previous allergy office visit was on 03/07/2024 with Dr. Marinda. Today is a regular follow up visit.  Discussed the use of AI scribe software for clinical note transcription with the patient, who gave verbal consent to proceed.  History of Present Illness            05/27/2024 UC visit: Patient exam is definitely consistent with urticaria. This is involving both her lower extremities. There is no evidence of cellulitis or abscess. Does not look eczematous or psoriatic. I did review her immunology notes from October. At that time they were considering Rhapsido for her chronic urticaria. I discussed with this patient. She is interested in this medicine. I suggested she reach out to immunology. She did see Dr. Marinda. I advised her that they would need to prescribe this medicine as it does have some significant risk factors and side effects and would need to be managed by them. In the meantime, I will give her some Decadron IM and a short course of steroids. Patient was happy with this plan. She states it worked well before. She has not been on steroids for quite some time. She is aware of the risks benefits and side effects. Patient was discharged from the urgent care.   Assessment and Plan: Carol [name] is a 58 y.o. female with: Chronic idiopathic urticaria-not controlled on Xolair Chronic idiopathic urticaria with intermittent episodes of daily hives since age 35, exacerbated post-COVID vaccination. Hives present on neck, arms, legs, buttocks,  and occasionally face with lip swelling. No response to high-dose antihistamines or Xolair (omalizumab) 300 mg monthly since April. 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 - Order blood work to assess IGE levels and autoimmune markers. - Continue current Xolair dosing and Zyrtec 20 mg BID until blood work results are available.  - Evaluate eligibility for increased Xolair dosing based on copay assistance status as well as likelihood of response based on labs (suspect more autoimmune phenotype and likely better responder to cyclosporine or tacrolimus) - Consider starting low-dose tacrolimus if blood work indicates autoimmune profile and Xolair is ineffective. Assessment and Plan              No follow-ups on file.  No orders of the defined types were placed in this encounter.  Lab Orders  No laboratory test(s) ordered today    Diagnostics: Spirometry:  Tracings reviewed. Her effort: {Blank single:19197::Good reproducible efforts.,It was hard to get consistent efforts and there is a question as to whether this reflects a maximal maneuver.,Poor effort, data can not be interpreted.} FVC: ***L FEV1: ***L, ***% predicted FEV1/FVC ratio: ***% Interpretation: {Blank single:19197::Spirometry consistent with mild obstructive disease,Spirometry consistent with moderate obstructive disease,Spirometry consistent with severe obstructive disease,Spirometry consistent with possible restrictive disease,Spirometry consistent with mixed obstructive and restrictive disease,Spirometry uninterpretable due to technique,Spirometry consistent with normal pattern,No overt abnormalities noted given today's efforts}.  Please see scanned spirometry results for details.  Skin  Testing: {Blank single:19197::Select foods,Environmental  allergy panel,Environmental allergy panel and select foods,Food allergy panel,None,Deferred due to recent antihistamines use}. *** Results discussed with patient/family.   Medication List:  Current Outpatient Medications  Medication Sig Dispense Refill   Cholecalciferol  (VITAMIN D3 PO) Take 1 tablet by mouth daily.     EPINEPHrine 0.3 mg/0.3 mL IJ SOAJ injection Inject 0.3 mg into the muscle as needed.     famotidine (PEPCID) 40 MG tablet Take 40 mg by mouth daily.     fexofenadine  (ALLEGRA ) 180 MG tablet Take 180 mg by mouth daily.     fluticasone  (FLONASE ) 50 MCG/ACT nasal spray 1 spray 2 (two) times daily. 1 g 2   GARLIC PO Take 1 capsule by mouth daily.     levothyroxine  (SYNTHROID , LEVOTHROID) 112 MCG tablet Take 125 mcg by mouth daily before breakfast.     lisinopril  (PRINIVIL ,ZESTRIL ) 10 MG tablet Take 10 mg by mouth daily.     loratadine (CLARITIN) 10 MG tablet Take 10 mg by mouth daily. (Patient not taking: Reported on 03/07/2024)     Multiple Vitamin (MULTIVITAMIN WITH MINERALS) TABS tablet Take 1 tablet by mouth daily.     neomycin -polymyxin-hydrocortisone (CORTISPORIN) OTIC solution Apply 1-2 drops to toe after soaking BID (Patient not taking: Reported on 03/07/2024) 10 mL 1   neomycin -polymyxin-hydrocortisone (CORTISPORIN) OTIC solution Apply 1-2 drops to toe after soaking BID (Patient not taking: Reported on 03/07/2024) 10 mL 1   valACYclovir (VALTREX) 500 MG tablet Take 500 mg by mouth as needed.     No current facility-administered medications for this visit.   Allergies: Allergies[1] I reviewed her past medical history, social history, family history, and environmental history and no significant changes have been reported from her previous visit.  Review of Systems  Constitutional:  Negative for appetite change, chills, fever and unexpected weight change.  HENT:  Negative for congestion and rhinorrhea.   Eyes:  Negative for itching.  Respiratory:  Negative for  cough, chest tightness, shortness of breath and wheezing.   Cardiovascular:  Negative for chest pain.  Gastrointestinal:  Negative for abdominal pain.  Genitourinary:  Negative for difficulty urinating.  Skin:  Negative for rash.  Neurological:  Negative for headaches.    Objective: LMP 01/22/2015  There is no height or weight on file to calculate BMI. Physical Exam Vitals and nursing note reviewed.  Constitutional:      Appearance: Normal appearance. She is well-developed.  HENT:     Head: Normocephalic and atraumatic.     Right Ear: Tympanic membrane and external ear normal.     Left Ear: Tympanic membrane and external ear normal.     Nose: Nose normal.     Mouth/Throat:     Mouth: Mucous membranes are moist.     Pharynx: Oropharynx is clear.  Eyes:     Conjunctiva/sclera: Conjunctivae normal.  Cardiovascular:     Rate and Rhythm: Normal rate and regular rhythm.     Heart sounds: Normal heart sounds. No murmur heard.    No friction rub. No gallop.  Pulmonary:     Effort: Pulmonary effort is normal.     Breath sounds: Normal breath sounds. No wheezing, rhonchi or rales.  Musculoskeletal:     Cervical back: Neck supple.  Skin:    General: Skin is warm.     Findings: No rash.  Neurological:     Mental Status: She is alert and oriented to person, place, and time.  Psychiatric:  Behavior: Behavior normal.    Previous notes and tests were reviewed. The plan was reviewed with the patient/family, and all questions/concerned were addressed.  It was my pleasure to see Carol [name] today and participate in her care. Please feel free to contact me with any questions or concerns.  Sincerely,  Orlan Cramp, DO Allergy & Immunology  [facility] office: [phone] [facility] office: [phone]    [1]  Allergies Allergen Reactions   Shellfish Allergy Hives    Crab    Sulfa Antibiotics Hives    Crab    Levonorgestrel-Ethinyl Estrad Rash   "

## 2024-06-06 ENCOUNTER — Ambulatory Visit: Admitting: Internal Medicine

## 2024-06-06 DIAGNOSIS — J302 Other seasonal allergic rhinitis: Secondary | ICD-10-CM

## 2024-06-23 ENCOUNTER — Other Ambulatory Visit: Payer: Self-pay | Admitting: Obstetrics and Gynecology

## 2024-06-23 DIAGNOSIS — Z1231 Encounter for screening mammogram for malignant neoplasm of breast: Secondary | ICD-10-CM

## 2024-07-01 ENCOUNTER — Ambulatory Visit: Admission: RE | Admit: 2024-07-01 | Source: Ambulatory Visit

## 2024-07-01 DIAGNOSIS — Z1231 Encounter for screening mammogram for malignant neoplasm of breast: Secondary | ICD-10-CM

## 2024-07-06 ENCOUNTER — Ambulatory Visit: Admitting: Allergy

## 2024-07-06 ENCOUNTER — Other Ambulatory Visit: Payer: Self-pay

## 2024-07-06 ENCOUNTER — Encounter: Payer: Self-pay | Admitting: Allergy

## 2024-07-06 VITALS — BP 130/92 | HR 78 | Temp 98.7°F | Resp 16 | Ht 66.0 in | Wt 196.0 lb

## 2024-07-06 DIAGNOSIS — L501 Idiopathic urticaria: Secondary | ICD-10-CM

## 2024-07-06 MED ORDER — MONTELUKAST SODIUM 10 MG PO TABS: 10.0000 mg | ORAL_TABLET | Freq: Every day | ORAL | 2 refills | Status: AC

## 2024-07-06 MED ORDER — FAMOTIDINE 20 MG PO TABS: 20.0000 mg | ORAL_TABLET | Freq: Two times a day (BID) | ORAL | 2 refills | Status: AC

## 2024-07-06 NOTE — Patient Instructions (Addendum)
 Hives: Based on clinical history, she likely has chronic idiopathic urticaria. Discussed with patient, that urticaria is usually caused by release of histamine by cutaneous mast cells but sometimes it is non-histamine mediated. Explained that urticaria is not always associated with allergies. In most cases, the exact etiology for urticaria can not be established and it is considered idiopathic. Avoid the following potential triggers: alcohol, tight clothing, NSAIDs.   Continue allegra  (fexofenadine ) 360mg  (2 tablets) twice a day. If symptoms are not controlled or causes drowsiness let us  know. Start Pepcid  (famotidine ) 20mg  twice a day.  Avoid the following potential triggers: alcohol, tight clothing, NSAIDs, hot showers and getting overheated. See below for proper skin care.  Start Singulair  (montelukast ) 10mg  daily at night. Cautioned that in some children/adults can experience behavioral changes including hyperactivity, agitation, depression, sleep disturbances and suicidal ideations. These side effects are rare, but if you notice them you should notify me and discontinue Singulair  (montelukast ). If you don't see any benefit with the above medications after 1 week then: START rhapsido 1 tablet twice a day - samples given. Then let us  know if you started this medication so we can start the prior authorization paperwork.  If you notice any issues with bleeding or planning on taking any blood thinner medicine let us  know. Please stop medication 3 to 7 days before and after any planned medical or surgical procedures.  Return in about 4 weeks (around 08/03/2024). Or sooner if needed.

## 2024-07-06 NOTE — Progress Notes (Signed)
 "  Follow Up Note  RE: Carol [name] MRN: [id number] DOB: 1966-05-14 Date of Office Visit: 07/06/2024  Referring provider: Asuncion Charleston, MD Primary care provider: Douglass Gerard DEL, PA-C  Chief Complaint: Follow-up and Urticaria (Flared up every day since a week - lip swelling up and hives on whole body. )  History of Present Illness: I had the pleasure of seeing Carol [name] for a follow up visit at the [facility] on 07/06/2024. She is a 59 y.o. female, who is being followed for CIU. Her previous allergy office visit was on 03/07/2024 with Dr. Marinda. Today is a new complaint visit of persistent hives.  Discussed the use of AI scribe software for clinical note transcription with the patient, who gave verbal consent to proceed.    She has been experiencing persistent hives for the past year and a half, with varying severity and location. The hives appear on her legs, thighs, buttocks, arms, and occasionally cause swelling of her jaw and lips. Some days are worse than others.  She has been taking Allegra , four tablets daily (two in the morning and two at night), but continues to experience frequent outbreaks. She previously tried Claritin, Benadryl, and Xolair, but found them ineffective. Xolair was administered every four weeks for four months, with the last dose in October, but she felt it worsened her condition.  In December, she visited the emergency department where she received steroids, which provided temporary relief. However, the patient reported that the last time she received steroids, they did not help as much as before. She ran out of Pepcid  a couple of weeks ago and is unsure if this has affected her symptoms.  No recent changes in medications, surgeries, or new diagnoses. She confirms having an EpiPen available. A recent blood workup was reported to be unremarkable.  No fever, chills, changes in appetite, or breathing difficulties. She  confirms experiencing itching and hives at the time of the visit.      05/27/2024 UC visit: Patient exam is definitely consistent with urticaria. This is involving both her lower extremities. There is no evidence of cellulitis or abscess. Does not look eczematous or psoriatic. I did review her immunology notes from October. At that time they were considering Rhapsido for her chronic urticaria. I discussed with this patient. She is interested in this medicine. I suggested she reach out to immunology. She did see Dr. Marinda. I advised her that they would need to prescribe this medicine as it does have some significant risk factors and side effects and would need to be managed by them. In the meantime, I will give her some Decadron IM and a short course of steroids. Patient was happy with this plan. She states it worked well before. She has not been on steroids for quite some time. She is aware of the risks benefits and side effects. Patient was discharged from the urgent care.   Assessment and Plan: Carol [name] is a 59 y.o. female with:    Chronic idiopathic urticaria Hives persistent for 1.5 years with limited response to antihistamines and Xolair. Steroids provided temporary relief. Labs showed slightly elevated CU. Based on clinical history, she likely has chronic idiopathic urticaria. Discussed with patient, that urticaria is usually caused by release of histamine by cutaneous mast cells but sometimes it is non-histamine mediated. Explained that urticaria is not always associated with allergies. In most cases, the exact etiology for urticaria can not be established and it is considered idiopathic. Avoid  the following potential triggers: alcohol, tight clothing, NSAIDs.  Continue allegra  (fexofenadine ) 360mg  (2 tablets) twice a day. If symptoms are not controlled or causes drowsiness let us  know. Start Pepcid  (famotidine ) 20mg  twice a day.  Avoid the following potential triggers: alcohol, tight  clothing, NSAIDs, hot showers and getting overheated. See below for proper skin care.  Start Singulair  (montelukast ) 10mg  daily at night. Cautioned that in some children/adults can experience behavioral changes including hyperactivity, agitation, depression, sleep disturbances and suicidal ideations. These side effects are rare, but if you notice them you should notify me and discontinue Singulair  (montelukast ). If you don't see any benefit with the above medications after 1 week then: START rhapsido 1 tablet twice a day - samples given. Then let us  know if you started this medication so we can start the prior authorization paperwork.  If you notice any issues with bleeding or planning on taking any blood thinner medicine let us  know. Please stop medication 3 to 7 days before and after any planned medical or surgical procedures.  Return in about 4 weeks (around 08/03/2024).  Meds ordered this encounter  Medications   famotidine  (PEPCID ) 20 MG tablet    Sig: Take 1 tablet (20 mg total) by mouth 2 (two) times daily.    Dispense:  60 tablet    Refill:  2   montelukast  (SINGULAIR ) 10 MG tablet    Sig: Take 1 tablet (10 mg total) by mouth at bedtime.    Dispense:  30 tablet    Refill:  2   Lab Orders  No laboratory test(s) ordered today    Diagnostics: None.   Medication List:  Current Outpatient Medications  Medication Sig Dispense Refill   Cholecalciferol  (VITAMIN D3 PO) Take 1 tablet by mouth daily.     EPINEPHrine 0.3 mg/0.3 mL IJ SOAJ injection Inject 0.3 mg into the muscle as needed.     famotidine  (PEPCID ) 20 MG tablet Take 1 tablet (20 mg total) by mouth 2 (two) times daily. 60 tablet 2   fexofenadine  (ALLEGRA ) 180 MG tablet Take 180 mg by mouth daily.     fluticasone  (FLONASE ) 50 MCG/ACT nasal spray 1 spray 2 (two) times daily. 1 g 2   GARLIC PO Take 1 capsule by mouth daily.     levothyroxine  (SYNTHROID , LEVOTHROID) 112 MCG tablet Take 125 mcg by mouth daily before  breakfast.     lisinopril  (PRINIVIL ,ZESTRIL ) 10 MG tablet Take 10 mg by mouth daily.     montelukast  (SINGULAIR ) 10 MG tablet Take 1 tablet (10 mg total) by mouth at bedtime. 30 tablet 2   Multiple Vitamin (MULTIVITAMIN WITH MINERALS) TABS tablet Take 1 tablet by mouth daily.     valACYclovir (VALTREX) 500 MG tablet Take 500 mg by mouth as needed.     No current facility-administered medications for this visit.   Allergies: Allergies[1] I reviewed her past medical history, social history, family history, and environmental history and no significant changes have been reported from her previous visit.  Review of Systems  Constitutional:  Negative for appetite change, chills, fever and unexpected weight change.  HENT:  Negative for congestion and rhinorrhea.   Eyes:  Negative for itching.  Respiratory:  Negative for cough, chest tightness, shortness of breath and wheezing.   Cardiovascular:  Negative for chest pain.  Gastrointestinal:  Negative for abdominal pain.  Genitourinary:  Negative for difficulty urinating.  Skin:  Positive for rash.  Neurological:  Negative for headaches.    Objective: BP (!) 130/92 (  BP Location: Left Arm, Patient Position: Sitting, Cuff Size: Normal)   Pulse 78   Temp 98.7 F (37.1 C) (Temporal)   Resp 16   Ht 5' 6 (1.676 m)   Wt 196 lb (88.9 kg)   LMP 01/22/2015   SpO2 99%   BMI 31.64 kg/m  Body mass index is 31.64 kg/m. Physical Exam Vitals and nursing note reviewed.  Constitutional:      Appearance: Normal appearance. She is well-developed.  HENT:     Head: Normocephalic and atraumatic.     Right Ear: Tympanic membrane and external ear normal.     Left Ear: Tympanic membrane and external ear normal.     Nose: Nose normal.     Mouth/Throat:     Mouth: Mucous membranes are moist.     Pharynx: Oropharynx is clear.  Eyes:     Conjunctiva/sclera: Conjunctivae normal.  Cardiovascular:     Rate and Rhythm: Normal rate and regular rhythm.      Heart sounds: Normal heart sounds. No murmur heard.    No friction rub. No gallop.  Pulmonary:     Effort: Pulmonary effort is normal.     Breath sounds: Normal breath sounds. No wheezing, rhonchi or rales.  Musculoskeletal:     Cervical back: Neck supple.  Skin:    General: Skin is warm.     Findings: Rash present.     Comments: Scattered urticarial rash on upper extremities b/l.  Neurological:     Mental Status: She is alert and oriented to person, place, and time.  Psychiatric:        Behavior: Behavior normal.    Previous notes and tests were reviewed. The plan was reviewed with the patient/family, and all questions/concerned were addressed.  It was my pleasure to see Carol [name] today and participate in her care. Please feel free to contact me with any questions or concerns.  Sincerely,  Orlan Cramp, DO Allergy & Immunology  [facility]  [facility] office: [phone] [facility] office: [phone]     [1]  Allergies Allergen Reactions   Shellfish Allergy Hives    Crab    Sulfa Antibiotics Hives    Crab   Levonorgestrel-Ethinyl Estrad Rash   "

## 2024-08-03 ENCOUNTER — Ambulatory Visit: Admitting: Allergy
